# Patient Record
Sex: Male | Born: 1955
Health system: Southern US, Community
[De-identification: ages and names within clinical notes are randomized; demographics above are authoritative.]

## PROBLEM LIST (undated history)

## (undated) DIAGNOSIS — B1920 Unspecified viral hepatitis C without hepatic coma: Secondary | ICD-10-CM

## (undated) DIAGNOSIS — Z86010 Personal history of colonic polyps: Secondary | ICD-10-CM

## (undated) DIAGNOSIS — J189 Pneumonia, unspecified organism: Secondary | ICD-10-CM

## (undated) DIAGNOSIS — K449 Diaphragmatic hernia without obstruction or gangrene: Secondary | ICD-10-CM

## (undated) DIAGNOSIS — M1990 Unspecified osteoarthritis, unspecified site: Secondary | ICD-10-CM

## (undated) HISTORY — DX: Personal history of colonic polyps: Z86.010

## (undated) HISTORY — PX: COLONOSCOPY: SHX174

## (undated) HISTORY — DX: Unspecified viral hepatitis C without hepatic coma: B19.20

## (undated) HISTORY — DX: Diaphragmatic hernia without obstruction or gangrene: K44.9

## (undated) HISTORY — DX: Unspecified osteoarthritis, unspecified site: M19.90

---

## 1977-11-26 HISTORY — PX: TONSILLECTOMY: SUR1361

## 2004-11-26 HISTORY — PX: TOTAL HIP ARTHROPLASTY: SHX124

## 2005-06-11 ENCOUNTER — Inpatient Hospital Stay (HOSPITAL_COMMUNITY): Admission: RE | Admit: 2005-06-11 | Discharge: 2005-06-15 | Payer: Self-pay | Admitting: Orthopedic Surgery

## 2008-10-04 ENCOUNTER — Ambulatory Visit: Payer: Self-pay | Admitting: Gastroenterology

## 2008-11-26 HISTORY — PX: UMBILICAL HERNIA REPAIR: SHX196

## 2009-04-13 ENCOUNTER — Ambulatory Visit: Payer: Self-pay | Admitting: Internal Medicine

## 2009-04-27 ENCOUNTER — Encounter: Payer: Self-pay | Admitting: Internal Medicine

## 2009-04-27 ENCOUNTER — Ambulatory Visit: Payer: Self-pay | Admitting: Internal Medicine

## 2009-05-05 ENCOUNTER — Encounter: Payer: Self-pay | Admitting: Internal Medicine

## 2009-07-25 ENCOUNTER — Ambulatory Visit (HOSPITAL_COMMUNITY): Admission: RE | Admit: 2009-07-25 | Discharge: 2009-07-26 | Payer: Self-pay | Admitting: Surgery

## 2010-12-26 NOTE — Letter (Signed)
Summary: Patient Notice- Polyp Results  [facility]  [address] [place], [place] [address]   Phone: [phone]  Fax: [phone]        May 05, 2009 MRN: [id number]    [name] [address] [place], [place]  [address]    Dear [name],  The polyp removed from your colon was adenomatous. This means that it was pre-cancerous or that  it had the potential to change into cancer over time.   I recommend that you have a repeat colonoscopy in 3 years to determine if you have developed any new polyps over time. If you develop any new rectal bleeding, abdominal pain or significant bowel habit changes, please contact us before then.  Please call us if you are having persistent problems or have questions about your condition that have not been fully answered at this time.  Sincerely,  Iva Boop MD  This letter has been electronically signed by your physician.

## 2010-12-26 NOTE — Miscellaneous (Signed)
Summary: [facility] Previsit/prep  Clinical Lists Changes  Medications: Added new medication of MOVIPREP 100 GM  SOLR (PEG-KCL-NACL-NASULF-NA ASC-C) As per prep instructions. - Signed Rx of MOVIPREP 100 GM  SOLR (PEG-KCL-NACL-NASULF-NA ASC-C) As per prep instructions.;  #1 x 0;  Signed;  Entered by: Wyona Almas RN;  Authorized by: Mardella Layman MD [facility];  Method used: Electronically to [facility] [address], [place], [place], [place]  [address], Ph: [phone], Fax: [phone] Observations: Added new observation of NKA: T (10/04/2008 7:51)    Prescriptions: MOVIPREP 100 GM  SOLR (PEG-KCL-NACL-NASULF-NA ASC-C) As per prep instructions.  #1 x 0   Entered by:   Wyona Almas RN   Authorized by:   Mardella Layman MD [facility]   Signed by:   Wyona Almas RN on 10/04/2008   Method used:   Electronically to        [facility] [address] (retail)       [address]       [place], [place]  [address]       Ph: [phone]       Fax: [phone]   RxID:   [id number]

## 2010-12-26 NOTE — Procedures (Signed)
Summary: Colonoscopy: Tubulovillous adenoma   Colonoscopy  Procedure date:  04/27/2009  Findings:      Location:  [facility].    Colonoscopy  Procedure date:  04/27/2009  Findings:      1) 8 mm sessile polyp in the ascending colon, removed TV ADENOMA 2) Otherwise normal examination, good prep  Comments:      Repeat colonoscopy in 3 years.     Procedures Next Due Date:    Colonoscopy: 04/2012  COLONOSCOPY PROCEDURE REPORT  PATIENT:  [name], [name]  MR#:  [id number] BIRTHDATE:   1956/05/09, 55 yrs. old   GENDER:   male  ENDOSCOPIST:   Iva Boop, MD, [facility] Referred by:         Self-referred  PROCEDURE DATE:  04/27/2009 PROCEDURE:  Colonoscopy with snare polypectomy ASA CLASS:   Class I INDICATIONS: colorectal cancer screening, average risk   MEDICATIONS:    Fentanyl 75 mcg IV, Versed 7 mg IV  DESCRIPTION OF PROCEDURE:   After the risks benefits and alternatives of the procedure were thoroughly explained, informed consent was obtained.  Digital rectal exam was performed and revealed no abnormalities and normal prostate.   The LB CF-H180AL [id number] endoscope was introduced through the anus and advanced to the cecum in 2:19 minutes, which was identified by both the appendix and ileocecal valve, without limitations.  The quality of the prep was good, using MiraLax.  The instrument was then slowly withdrawn as the colon was fully examined in 10:26 minutes. <<PROCEDUREIMAGES>>      <<OLD IMAGES>>  FINDINGS:  A sessile polyp was found in the ascending colon. It was 8 mm in size. Polyp was snared, then cauterized with monopolar cautery. Retrieval was successful. snare polyp  This was otherwise a normal examination of the colon.   Retroflexed views in the rectum revealed no abnormalities.    The scope was then withdrawn from the patient and the procedure completed.  COMPLICATIONS:   None  ENDOSCOPIC IMPRESSION:  1) 8 mm sessile polyp in the ascending colon,  removed  2) Otherwise normal examination, good prep RECOMMENDATIONS:  1) No aspirin or NSAID's for 1 week.  REPEAT EXAM:   In for Colonoscopy, pending biopsy results.   Iva Boop, MD, [facility]  CC: The Patient     REPORT OF SURGICAL PATHOLOGY   Case #: [id number] Patient Name: [name], [name] Office Chart Number:  [id number]   MRN: [id number] Pathologist: Ivin Booty B. Colonel Bald, MD DOB/Age  December 03, 1955 (Age: 55)    Gender: M Date Taken:  04/27/2009 Date Received: 04/27/2009   FINAL DIAGNOSIS   ***MICROSCOPIC EXAMINATION AND DIAGNOSIS***   COLON, ASCENDING, POLYP: -  TUBULOVILLOUS ADENOMA. -  HIGH GRADE DYSPLASIA IS NOT IDENTIFIED.   mw Date Reported:  04/28/2009     Ivin Booty B. Colonel Bald, MD *** Electronically Signed Out By JBK ***    May 05, 2009 MRN: [id number]  [name] [address] [place], [place]  [address]    Dear Mr. [name],  The polyp removed from your colon was adenomatous. This means that it was pre-cancerous or that  it had the potential to change into cancer over time.   I recommend that you have a repeat colonoscopy in 3 years to determine if you have developed any new polyps over time. If you develop any new rectal bleeding, abdominal pain or significant bowel habit changes, please contact us before then.   Please call us if you are having persistent problems or have questions about your  condition that have not been fully answered at this time.  Sincerely,  Iva Boop MD  This letter has been electronically signed by your physician. This report was created from the original endoscopy report, which was reviewed and signed by the above listed endoscopist.

## 2010-12-26 NOTE — Miscellaneous (Signed)
Summary: [facility] Previsit/prep  Clinical Lists Changes  Medications: Added new medication of DULCOLAX 5 MG  TBEC (BISACODYL) Day before procedure take 2 at 3pm and 2 at 8pm. - Signed Added new medication of METOCLOPRAMIDE HCL 10 MG  TABS (METOCLOPRAMIDE HCL) As per prep instructions. - Signed Added new medication of MIRALAX   POWD (POLYETHYLENE GLYCOL 3350) As per prep  instructions. - Signed Rx of DULCOLAX 5 MG  TBEC (BISACODYL) Day before procedure take 2 at 3pm and 2 at 8pm.;  #4 x 0;  Signed;  Entered by: Wyona Almas RN;  Authorized by: Iva Boop MD;  Method used: Electronically to [facility] [address], [place], [place], [place]  [address], Ph: [phone], Fax: [phone] Rx of METOCLOPRAMIDE HCL 10 MG  TABS (METOCLOPRAMIDE HCL) As per prep instructions.;  #2 x 0;  Signed;  Entered by: Wyona Almas RN;  Authorized by: Iva Boop MD;  Method used: Electronically to [facility] [address] [place], [place], [place]  [address], Ph: [phone], Fax: [phone] Rx of MIRALAX   POWD (POLYETHYLENE GLYCOL 3350) As per prep  instructions.;  #255gm x 0;  Signed;  Entered by: Wyona Almas RN;  Authorized by: Iva Boop MD;  Method used: Electronically to [facility] [address], [place], [place], [place]  [address], Ph: [phone], Fax: [phone] Observations: Added new observation of NKA: T (04/13/2009 13:24)    Prescriptions: MIRALAX   POWD (POLYETHYLENE GLYCOL 3350) As per prep  instructions.  #255gm x 0   Entered by:   Wyona Almas RN   Authorized by:   Iva Boop MD   Signed by:   Wyona Almas RN on 04/13/2009   Method used:   Electronically to        [facility] [address] (retail)       [address]       [place], [place]  [address]       Ph: [phone]       Fax: [phone]   RxID:    [id number] METOCLOPRAMIDE HCL 10 MG  TABS (METOCLOPRAMIDE HCL) As per prep instructions.  #2 x 0   Entered by:   Wyona Almas RN   Authorized by:   Iva Boop MD   Signed by:   Wyona Almas RN on 04/13/2009   Method used:   Electronically to        [facility] [address] (retail)       [address]       [place], [place]  [address]       Ph: [phone]       Fax: [phone]   RxID:   [id number] DULCOLAX 5 MG  TBEC (BISACODYL) Day before procedure take 2 at 3pm and 2 at 8pm.  #4 x 0   Entered by:   Wyona Almas RN   Authorized by:   Iva Boop MD   Signed by:   Wyona Almas RN on 04/13/2009   Method used:   Electronically to        [facility] [address] (retail)       [address]       [place], [place]  [address]  Ph: [phone]       Fax: [phone]   RxID:   [id number]

## 2011-03-03 LAB — HEMOGLOBIN AND HEMATOCRIT, BLOOD
HCT: 42.5 % (ref 39.0–52.0)
Hemoglobin: 14.9 g/dL (ref 13.0–17.0)

## 2011-04-10 NOTE — Op Note (Signed)
NAME:  [name], [name] NO.:  [id number]   MEDICAL RECORD NO.:  [id number]          PATIENT TYPE:  AMB   LOCATION:  DAY                          FACILITY:  [facility]   PHYSICIAN:  Ardeth Sportsman, MD     DATE OF BIRTH:  1956-04-26   DATE OF PROCEDURE:  DATE OF DISCHARGE:                               OPERATIVE REPORT   PRIMARY CARE PHYSICIAN:  Vikki Ports, M.D.   SURGEON:  Ardeth Sportsman, MD   PREOPERATIVE DIAGNOSES:  1. Left inguinal hernia.  2. Possible right hernia.   POSTOPERATIVE DIAGNOSES:  Left greater than right bilateral indirect  inguinal hernia.   PROCEDURE PERFORMED:  Laparoscopic bilateral inguinal preperitoneal  exploration with repair.   ANESTHESIA:  1. General anesthesia.  2. Bilateral ilioinguinal/genitofemoral slash spermatic cord nerve      blocks.  3. Local anesthetic field block around port sites.   SPECIMENS:  None.   DRAINS:  None.   ESTIMATED BLOOD LOSS:  100 mL.   COMPLICATIONS:  None major.   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  He was also found to have evidence of a  definite hernia on the left groin and a probable one on the right as  well.    Known physiology of abdominal formation and pathophysiology of inguinal  herniation with its natural history.  Risks were discussed.  Options  discussed.  Recommendations made for laparoscopic bilateral inguinal  exploration with repair.  Risks, benefits, alternatives discussed.  Questions answered, and he agreed to proceed.   FINDINGS:  He had  left greater than right indirect inguinal hernias.  He had a significant volume of cord lipomas bilaterally.  There was no  evidence of any direct femoral or obturator defects.   DESCRIPTION OF PROCEDURE PERFORMED:  The patient had IV cefazolin just  prior to surgery.  He voided just prior to being brought  in the  operating room.  He had sequential compression devices active through  the entire case.  He underwent general anesthesia without any  difficulty.  He was positioned supine with both arms tucked.  His  abdomen and about the penis were prepped and draped in sterile fashion.  Surgical time-out confirmed our plan.   I placed a 12 mm Hasson port in the preperitoneal plane just to the  right of and underneath the right rectus muscle using Hasson technique.  Capno preperitoneum to 15 mmHg for good abdominal wall insufflation.  Camera dissection was used to free the peritoneum off the bilateral  lower quadrants.  Enough working space was created such that I could put  5-0 ports in the right and left midabdomen.  I turned attention towards  the left side which was the more obvious side.  I could free the  peritoneum off the anterior abdominal wall and the left flank.  Following the peritoneum, I could see it going into the internal ring  consistent with an indirect inguinal hernia.  He  had very large cord  lipomas which I carefully freed and skeletonized off the spermatic cord  to better identify the peritoneal hernia sac.  I was able to reduce the  cord lipomas and skeletonized them and free them out and reduce them.  I  was able to help reduce the inguinal hernia sac and peel that down.  I  peeled the inguinal hernia sac proximally off the spermatic cord vessels  and vas deferens as possible.  I made a window between the bladder and  the lateral bladder wall and the anteromedial pelvis.   A carried out dissection and a mirror image fashion on the right side.  Initially, the planes were not as well defined on the right side.  He  had had a vasectomy but he had never had any appendectomy but,  nonetheless, initially I think I was more in the retrorectal and not in  the true preperitoneal plane, but eventually I was able to reorganize it  and find the correct plane.  In dissecting the  peritoneum off the  inferior epigastric vessel on the right side, I did have some bleeding  from some of the branches of the intermuscular feeding branches.  These  were easily controlled with cautery at the time.  This was a source of  mostly oozing and bleeding.  In dissecting over the right side, I did  make a tear in the peritoneum, and this was closed using 3-0 Vicryl  stitch using laparoscopic intracorporeal suturing techniques.   I chose 15x15 cm ultralight weight polypropylene (Ultrapro) mesh.  I cut  a piece of mesh such that there was a five x five corner rested between  the bladder and the true pelvis.  The mesh laid well posteriorly,  laterally, superiorly and medially, and actually the superomedial tails  overlapped in the midline.  There was excellent coverage and overlap  such that there was at least 3 inches of circumferential coverage around  the internal rings.  The cord lipomas and hernia sacs were grasped and  elevated cephalad as capto preperitoneum was evacuated.  Ports were  removed.  I did make an opening in the peritoneum just at the umbilical  stalk and evacuated the rest of the capnoperitoneum.  Close the fascia  using a 0 Vicryl stitch.  Skin was closed with 4-0 Monocryl stitch.  Sterile dressings applied.  The patient was extubated and recovered in  stable condition.   I discussed postoperative care with the patient in the office and then  just prior to surgery.  I am about to discuss it with his wife as well  per his request.      Ardeth Sportsman, MD  Electronically Signed     SCG/MEDQ  D:  07/25/2009  T:  07/25/2009  Job:  [id number]   cc:   Vikki Ports, M.D.  Fax: [phone]   Anna Genre. Little, M.D.  Fax: [phone]

## 2011-04-13 NOTE — Discharge Summary (Signed)
NAME:  [name], [name] NO.:  [id number]   MEDICAL RECORD NO.:  [id number]          PATIENT TYPE:  INP   LOCATION:  1508                         FACILITY:  [facility]   PHYSICIAN:  Ollen Gross, M.D.    DATE OF BIRTH:  December 10, 1955   DATE OF ADMISSION:  06/11/2005  DATE OF DISCHARGE:  06/15/2005                                 DISCHARGE SUMMARY   ADMISSION DIAGNOSES:  1.  Osteoarthritis, left hip.  2.  History of pneumonia.  3.  Hepatitis C.   DISCHARGE DIAGNOSES:  1.  Osteoarthritis, left hip, status post left total hip arthroplasty.  2.  History of pneumonia.  3.  Hepatitis C.   PROCEDURE:  On June 11, 2005, left total hip.   SURGEON:  Dr. Lequita Halt.   ASSISTANT:  [name], P.A.C.   ANESTHESIA:  General.   BLOOD LOSS:  700 mL.  Hemovac drain x 1.   CONSULTATIONS:  None.   BRIEF HISTORY AND PHYSICAL:  [name] is a 55 year old male with severe end-  stage osteoarthritis of the left hip and intractable pain who now presents  for a left total hip arthroplasty.   LABORATORY DATA:  CBC preop:  Hemoglobin 15.0, hematocrit of 43.1, white  cell count 5300.  Differential normal.  Postop H&H 12.7 and 35.5.  PT/PTT  preop 13.2 and 28.0 respectively.  INR 1.0.  Serial PTs were followed, last  PT and INR of 25.7 and 2.3.  Chem panel all within normal limits.  Serial  chemistries were followed.  Sodium dropped a little bit from 138 to 131.  Potassium dropped some from 4.1 to 3.4.  Total bili was slightly elevated on  admission at 2.2.  Urinalysis negative.  Blood group type A negative.  Portable left hip film and pelvis film on June 11, 2005:  Good position and  alignment of the left hip prosthesis.  Two view of chest on June 07, 2005:  No active lung disease, questionable granuloma in the right mid-and-upper  lung field compared prior to chest x-ray, cardiomegaly.  Left hip preop film  on June 07, 2005:  Advanced degenerative changes of the left hip.  EKG  dated  June 07, 2005:  Sinus bradycardia.  Otherwise normal, unconfirmed.   HOSPITAL COURSE:  Admitted to [facility], and taken to the OR,  underwent above-stated procedure.  He tolerated it well.  Began recovery on  the orthopedic floor, and was started on PCA and p.o. analgesics.  Hemovac  drain placed at the time of surgery, pulled on day 1.  He started to get up  out of bed.  He was slow getting up initially, and had a little bit of  abdominal bloating.  By day 2,  he had a little bit of increase in belly  pain, and was constipated.  He was given laxatives, and did have small bowel  movements.  By day 2, he started getting up with therapy a little bit more.  On day 2, dressing was changed, and incision was healing well.  It did have  some blisters which  were covered with Tegaderm.  He was started on Reglan  and Gas-X for the abdominal bloating.  He did get response from this.  By  later that evening, he was feeling much better.  By day 3, he started  getting up and around a little bit more with therapy, and by day 4, he was  doing well, and discharged home.   DISCHARGE PLAN:  The patient was discharged home on June 15, 2005.   DISCHARGE DIAGNOSES:  Please see above.   DISCHARGE MEDICATIONS:  Coumadin, Percocet, Robaxin, and Z-Pak.   DIET:  As tolerated.   ACTIVITY:  He is partial weightbearing of 25-50% to the left lower  extremity.  Hip precautions at all times.  Total hip protocol.   FOLLOWUP:  Follow up in 2 weeks.  Call the office for an appointment at [phone].   DISPOSITION:  Home.   CONDITION ON DISCHARGE:  Improved.       ALP/MEDQ  D:  06/15/2005  T:  06/15/2005  Job:  [id number]   cc:   Ollen Gross, M.D.  [facility]  [address]  [place] [address]  [place]  [place] [address]  Fax: [phone]

## 2011-04-13 NOTE — H&P (Signed)
NAME:  [name], [name] NO.:  [id number]   MEDICAL RECORD NO.:  [id number]          PATIENT TYPE:  INP   LOCATION:  1508                         FACILITY[facility]   PHYSICIAN:  Ollen Gross, M.D.    DATE OF BIRTH:  Mar 27, 1956   DATE OF ADMISSION:  06/11/2005  DATE OF DISCHARGE:                                HISTORY & PHYSICAL   CHIEF COMPLAINT:  Left hip pain.   HISTORY OF PRESENT ILLNESS:  Patient is a 55 year old male who has recently  moved here from [place].  Comes in for evaluation of his left hip.  The pain  has been ongoing for about seven years now but has progressively gotten  worse over the past six months.  He was told by his orthopedic surgeon in  [place] that needed hip replacement. He has moved to [place] recently and  comes in for evaluation.  Extremely athletic throughout his life.  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  Unfortunately, his hip pain is interfering with his  activity, and he has reached the point where he would like to have something  done about it.  Risks and benefits have been discussed.  The patient elects  to proceed with surgery.  He has been seen in the office.  X-rays do show  that he has severe end-stage arthritis of the left hip with bone-on-bone  deformity.   ALLERGIES:  No known drug allergies.   CURRENT MEDICATIONS:  No meds except for the occasional pain pill in the  form of Vicodin.   PAST MEDICAL HISTORY:  1.  Pneumonia.  2.  History of hepatitis C.   PAST SURGICAL HISTORY:  1.  Tonsil surgery.  2.  Nose surgery.   SOCIAL HISTORY:  Married.  Currently works in Airline pilot.  Nonsmoker.  No  alcohol.  Has four children.   FAMILY HISTORY:  Mother is age 7 and in good health.  Father with a history  of stroke and heart disease.   REVIEW OF SYSTEMS:  GENERAL:  No fevers, chills, night sweats.  NEURO:  No  seizures, syncope, paralysis.  He does have  some muffled sounds and popping  pain in the right ear.  RESPIRATORY:  No shortness of breath, productive  cough, or hemoptysis.  CARDIOVASCULAR:  No chest pain, angina, orthopnea.  GI:  History of hepatitis C.  No nausea, vomiting, diarrhea, or  constipation.  GU:  No dysuria, hematuria, or discharge.  MUSCULOSKELETAL:  Left hip, from the history of present illness.   PHYSICAL EXAMINATION:  VITAL SIGNS:  Pulse 76, respirations 12, blood  pressure 142/82.  GENERAL:  A 55 year old white male who is well-developed and well-nourished,  large-framed, muscular build.  Alert, oriented and cooperative.  Very  pleasant.  No acute distress.  HEENT:  Normocephalic and atraumatic.  Pupils are round and reactive.  Oropharynx is clear.  EOMs are intact.  NECK:  Supple.  No carotid bruits.  CHEST:  Clear anterior and posterior chest wall.  No rales, rhonchi or  wheezes.  HEART:  Regular rate and rhythm without murmur.  ABDOMEN:  Soft, nontender.  Bowel sounds present.  RECTAL/BREASTS/GENITALIA:  Not done.  Not pertinent to the present illness.  EXTREMITIES:  Left hip:  The left hip only has flexion of about 90 degrees.  There is 0 internal rotation.  There is 0 external rotation with only about  5 degrees of abduction.  He does ambulate with an antalgic gait.   IMPRESSION:  1.  Osteoarthritis, left hip.  2.  History of pneumonia.  3.  Hepatitis C.   PLAN:  Patient is admitted to [facility] to undergo a left total  hip arthroplasty.  Surgery will be performed by Dr. Trudee Grip.       ALP/MEDQ  D:  06/11/2005  T:  06/11/2005  Job:  [id number]

## 2011-04-13 NOTE — Op Note (Signed)
NAME:  [name], [name] NO.:  [id number]   MEDICAL RECORD NO.:  [id number]          PATIENT TYPE:  INP   LOCATION:  NA                           FACILITY:  [facility]   PHYSICIAN:  Ollen Gross, M.D.    DATE OF BIRTH:  08-03-1956   DATE OF PROCEDURE:  06/11/2005  DATE OF DISCHARGE:                                 OPERATIVE REPORT   PREOPERATIVE DIAGNOSIS:  Osteoarthritis left hip.   POSTOPERATIVE DIAGNOSIS:  Osteoarthritis left hip.   PROCEDURE:  Left total hip arthroplasty.   SURGEON:  Ollen Gross, M.D.   ASSISTANT:  Alanson Aly. Julien Girt, P.A.   ANESTHESIA:  General.   ESTIMATED BLOOD LOSS:  700.   DRAINS:  Hemovac x1.   COMPLICATIONS:  None.   CONDITION:  Stable to recovery.   BRIEF CLINICAL NOTE:  [name] is a 55 year old male who has severe end-stage  arthritis of the left hip with intractable pain. He presents now for left  total hip arthroplasty.   PROCEDURE IN DETAIL:  After successful administration of general anesthetic,  the patient was placed in the right lateral decubitus position with the left  side up and held with a hip positioner. The left lower extremity was  isolated from his perineum with plastic drapes and prepped and draped in the  usual sterile fashion. A short posterolateral incision was made with a 10  blade through the subcutaneous tissue to the level of fascia lata which was  incised in line with a skin incision. The sciatic nerve was palpated and  protected and short rotators isolated off the femur. Capsulectomy was  performed and the hip was dislocated. The center of the femoral head is  marked and a trial prosthesis placed such that the center of the trial head  corresponds to the center of his native femoral head. Osteotomy lines marked  on the femoral neck and osteotomy made with an oscillating saw. The femoral  head is removed and then femur retracted anteriorly to gain acetabular  exposure.   Acetabular exposure was obtained  and then osteophytes and labrum were  removed. Reaming starts at 49 mm coursing in increments of 2 to 59 mm and  then a 60 mm pinnacle acetabular shell was placed in anatomic position with  excellent purchase and transfixed with a dome screw. Trial 36 mm neutral  liner was placed.   The femur was prepared first with the canal finder and then irrigation.  Axial reaming is performed up to 17.5 mm and proximal reaming up to a 22D.  The sleeve is machined to a large and a 22D large trial sleeve is placed  with a 22 x 17 stem and 36 plus 12 neck matching his native anteversion. A  36 plus 0 head was placed and there is a tiny bit of soft tissue laxity so I  went to a 36 plus 3. He had excellent stability with full extension, full  external rotation, 90 degrees of flexion, 70 degrees adduction, 90 degrees  internal rotation and then 90 degrees of flexion and 70 degrees of internal  rotation.  By placing the left leg on top of the right, the leg lengths were  found to be equal. All trials were then removed and the permanent apex hole  eliminator was placed into the acetabular shell. A permanent 36 mm neutral  Ultamet metal liner is then placed into the acetabular shell. This is a  metal-on-metal hip replacement. The permanent 22D large sleeve was then  placed into the proximal femur with a 22 x 17 stem, 36 plus 12 neck matching  his native anteversion. A 36 plus 3 head was placed and the hip is reduced  with the same stability parameters. The wound was copiously irrigated with  saline solution and short rotators reattached to the femur through drill  holes. Fascia lata was closed over a Hemovac drain with interrupted #1  Vicryl, subcu closed with #1 and #2-0 Vicryl and subcuticular running 4-0  Monocryl. Incisions cleaned and dried and Steri-Strips and a bulky sterile  dressing applied. A drain is hooked to suction and he is placed into a knee  immobilizer, awakened and transported to recovery in  stable condition.  Please note that I injected 30 mL of 0.25% Marcaine with epinephrine into  the subcutaneous tissues about the incision prior to placing the sterile  dressing.       FA/MEDQ  D:  06/11/2005  T:  06/12/2005  Job:  [id number]

## 2011-10-20 ENCOUNTER — Other Ambulatory Visit: Payer: Self-pay | Admitting: Orthopedic Surgery

## 2012-03-18 ENCOUNTER — Encounter: Payer: Self-pay | Admitting: Internal Medicine

## 2012-03-21 ENCOUNTER — Ambulatory Visit (HOSPITAL_COMMUNITY)
Admission: RE | Admit: 2012-03-21 | Payer: BC Managed Care – PPO | Source: Ambulatory Visit | Admitting: Orthopedic Surgery

## 2012-03-21 ENCOUNTER — Encounter (HOSPITAL_COMMUNITY): Admission: RE | Payer: Self-pay | Source: Ambulatory Visit

## 2012-03-21 SURGERY — ARTHROPLASTY, HIP, TOTAL,POSTERIOR APPROACH
Anesthesia: Choice | Site: Hip | Laterality: Right

## 2012-06-09 ENCOUNTER — Encounter: Payer: Self-pay | Admitting: Internal Medicine

## 2012-07-14 ENCOUNTER — Encounter: Payer: Self-pay | Admitting: Internal Medicine

## 2012-07-14 ENCOUNTER — Ambulatory Visit (AMBULATORY_SURGERY_CENTER): Payer: BC Managed Care – PPO | Admitting: *Deleted

## 2012-07-14 VITALS — Ht 76.0 in | Wt 306.0 lb

## 2012-07-14 DIAGNOSIS — Z1211 Encounter for screening for malignant neoplasm of colon: Secondary | ICD-10-CM

## 2012-07-14 MED ORDER — MOVIPREP 100 G PO SOLR: ORAL | Status: DC

## 2012-08-05 ENCOUNTER — Encounter: Payer: Self-pay | Admitting: Internal Medicine

## 2012-08-05 ENCOUNTER — Ambulatory Visit (AMBULATORY_SURGERY_CENTER): Payer: BC Managed Care – PPO | Admitting: Internal Medicine

## 2012-08-05 VITALS — BP 112/67 | HR 61 | Temp 96.8°F | Resp 96 | Ht 76.0 in | Wt 306.0 lb

## 2012-08-05 DIAGNOSIS — K5730 Diverticulosis of large intestine without perforation or abscess without bleeding: Secondary | ICD-10-CM

## 2012-08-05 DIAGNOSIS — Z860100 Personal history of colon polyps, unspecified: Secondary | ICD-10-CM

## 2012-08-05 DIAGNOSIS — Z1211 Encounter for screening for malignant neoplasm of colon: Secondary | ICD-10-CM

## 2012-08-05 HISTORY — DX: Personal history of colon polyps, unspecified: Z86.0100

## 2012-08-05 HISTORY — DX: Personal history of colonic polyps: Z86.010

## 2012-08-05 MED ORDER — SODIUM CHLORIDE 0.9 % IV SOLN: 500.0000 mL | INTRAVENOUS | Status: DC

## 2012-08-05 NOTE — Patient Instructions (Addendum)
The colonoscopy showed some diverticulosis but no polyps or cancer.  Next routine colonoscopy in 5 years - 2018  Thank you for choosing me and [facility].  Iva Boop, MD, FACGYOU HAD AN ENDOSCOPIC PROCEDURE TODAY AT THE [facility]: Refer to the procedure report that was given to you for any specific questions about what was found during the examination.  If the procedure report does not answer your questions, please call your gastroenterologist to clarify.  If you requested that your care partner not be given the details of your procedure findings, then the procedure report has been included in a sealed envelope for you to review at your convenience later.  YOU SHOULD EXPECT: Some feelings of bloating in the abdomen. Passage of more gas than usual.  Walking can help get rid of the air that was put into your GI tract during the procedure and reduce the bloating. If you had a lower endoscopy (such as a colonoscopy or flexible sigmoidoscopy) you may notice spotting of blood in your stool or on the toilet paper. If you underwent a bowel prep for your procedure, then you may not have a normal bowel movement for a few days.  DIET: Your first meal following the procedure should be a light meal and then it is ok to progress to your normal diet.  A half-sandwich or bowl of soup is an example of a good first meal.  Heavy or fried foods are harder to digest and may make you feel nauseous or bloated.  Likewise meals heavy in dairy and vegetables can cause extra gas to form and this can also increase the bloating.  Drink plenty of fluids but you should avoid alcoholic beverages for 24 hours.  ACTIVITY: Your care partner should take you home directly after the procedure.  You should plan to take it easy, moving slowly for the rest of the day.  You can resume normal activity the day after the procedure however you should NOT DRIVE or use heavy machinery for 24 hours (because of the  sedation medicines used during the test).    SYMPTOMS TO REPORT IMMEDIATELY: A gastroenterologist can be reached at any hour.  During normal business hours, 8:30 AM to 5:00 PM Monday through Friday, call [phone].  After hours and on weekends, please call the GI answering service at [phone] who will take a message and have the physician on call contact you.   Following lower endoscopy (colonoscopy or flexible sigmoidoscopy):  Excessive amounts of blood in the stool  Significant tenderness or worsening of abdominal pains  Swelling of the abdomen that is new, acute  Fever of 100F or higher  Following upper endoscopy (EGD)  Vomiting of blood or coffee ground material  New chest pain or pain under the shoulder blades  Painful or persistently difficult swallowing  New shortness of breath  Fever of 100F or higher  Black, tarry-looking stools  FOLLOW UP: If any biopsies were taken you will be contacted by phone or by letter within the next 1-3 weeks.  Call your gastroenterologist if you have not heard about the biopsies in 3 weeks.  Our staff will call the home number listed on your records the next business day following your procedure to check on you and address any questions or concerns that you may have at that time regarding the information given to you following your procedure. This is a courtesy call and so if there is no answer at the home number  and we have not heard from you through the emergency physician on call, we will assume that you have returned to your regular daily activities without incident.  SIGNATURES/CONFIDENTIALITY: You and/or your care partner have signed paperwork which will be entered into your electronic medical record.  These signatures attest to the fact that that the information above on your After Visit Summary has been reviewed and is understood.  Full responsibility of the confidentiality of this discharge information lies with you and/or your  care-partner.   Diverticulosis and high fiber diet information given.

## 2012-08-05 NOTE — Progress Notes (Signed)
Patient did not experience any of the following events: a burn prior to discharge; a fall within the facility; wrong site/side/patient/procedure/implant event; or a hospital transfer or hospital admission upon discharge from the facility. (G8907) Patient did not have preoperative order for IV antibiotic SSI prophylaxis. (G8918)  

## 2012-08-05 NOTE — Op Note (Signed)
[facility] [address] [place], [address]   COLONOSCOPY PROCEDURE REPORT  PATIENT: [name], [name]  MR#: [id number] BIRTHDATE: 04/29/1956 , 56  yrs. old GENDER: Male ENDOSCOPIST: Iva Boop, MD, [facility] REFERRED BY: PROCEDURE DATE:  08/05/2012 PROCEDURE:   Colonoscopy, diagnostic ASA CLASS:   Class I INDICATIONS:screening and surveillance,personal history of adenomatous colon polyps. MEDICATIONS: propofol (Diprivan) 250mg  IV, MAC sedation, administered by CRNA, and These medications were titrated to patient response per physician's verbal order  DESCRIPTION OF PROCEDURE:   After the risks benefits and alternatives of the procedure were thoroughly explained, informed consent was obtained.  A digital rectal exam revealed no abnormalities of the rectum and A digital rectal exam revealed the prostate was not enlarged.   The LB CF-[id number]  endoscope was introduced through the anus and advanced to the cecum, which was identified by both the appendix and ileocecal valve. No adverse events experienced.   The quality of the prep was excellent, using MoviPrep  The instrument was then slowly withdrawn as the colon was fully examined.      COLON FINDINGS: Mild diverticulosis was noted in the sigmoid colon. The colon mucosa was otherwise normal.  Retroflexed views in right colon and rectum  revealed no abnormalities. The time to cecum=2 minutes 50 seconds.  Withdrawal time=7 minutes 58 seconds.  The scope was withdrawn and the procedure completed. COMPLICATIONS: There were no complications.  ENDOSCOPIC IMPRESSION: 1.   Mild diverticulosis was noted in the sigmoid colon 2.   The colon mucosa was otherwise normal with excellent prep 3.   Personal history of 8 mm tubulovillous adenoma 2010  RECOMMENDATIONS: repeat Colonoscopy in 5 years.   eSigned:  Iva Boop, MD, [facility] 08/05/2012 9:06 AM   cc: The Patient   PATIENT NAME:  [name], [name] MR#:  [id number]

## 2012-08-05 NOTE — Progress Notes (Signed)
The pt tolerated the colonoscopy very well. Maw   

## 2012-08-06 ENCOUNTER — Telehealth: Payer: Self-pay | Admitting: *Deleted

## 2012-08-06 NOTE — Telephone Encounter (Signed)
  Follow up Call-  Call back number 08/05/2012  Post procedure Call Back phone  # [phone]  Permission to leave phone message Yes     Patient questions:  Do you have a fever, pain , or abdominal swelling? no Pain Score  0 *  Have you tolerated food without any problems? yes  Have you been able to return to your normal activities? yes  Do you have any questions about your discharge instructions: Diet   no Medications  no Follow up visit  no  Do you have questions or concerns about your Care? no  Actions: * If pain score is 4 or above: No action needed, pain <4.

## 2013-09-09 ENCOUNTER — Other Ambulatory Visit: Payer: Self-pay | Admitting: Otolaryngology

## 2013-09-09 DIAGNOSIS — H905 Unspecified sensorineural hearing loss: Secondary | ICD-10-CM

## 2013-09-09 DIAGNOSIS — H903 Sensorineural hearing loss, bilateral: Secondary | ICD-10-CM

## 2013-09-23 ENCOUNTER — Ambulatory Visit
Admission: RE | Admit: 2013-09-23 | Discharge: 2013-09-23 | Disposition: A | Discharge status: Home / Self Care | Payer: BC Managed Care – PPO | Source: Ambulatory Visit | Attending: Otolaryngology | Admitting: Otolaryngology

## 2013-09-23 DIAGNOSIS — H905 Unspecified sensorineural hearing loss: Secondary | ICD-10-CM

## 2013-09-23 DIAGNOSIS — H903 Sensorineural hearing loss, bilateral: Secondary | ICD-10-CM

## 2013-09-23 MED ORDER — GADOBENATE DIMEGLUMINE 529 MG/ML IV SOLN
20.0000 mL | Freq: Once | INTRAVENOUS | Status: AC | PRN
  Administered 2013-09-23: 20 mL via INTRAVENOUS

## 2013-10-19 NOTE — Progress Notes (Signed)
Surgery scheduled for 11/06/13.  Preop on 10/29/13 at 100pm.  Need orders in EPIC.  Thank You.

## 2013-10-20 ENCOUNTER — Other Ambulatory Visit: Payer: Self-pay | Admitting: Orthopedic Surgery

## 2013-10-20 NOTE — Progress Notes (Signed)
Preoperative surgical orders have been place into the Epic hospital system for [name] on 10/20/2013, 1:18 PM  by Patrica Duel for surgery on 11/06/2013.  Preop Total Hip orders including Experel Injecion, PO Tylenol, and IV Decadron as long as there are no contraindications to the above medications. Avel Peace, PA-C

## 2013-10-26 ENCOUNTER — Encounter (HOSPITAL_COMMUNITY): Payer: Self-pay | Admitting: Pharmacy Technician

## 2013-10-29 ENCOUNTER — Encounter (INDEPENDENT_AMBULATORY_CARE_PROVIDER_SITE_OTHER): Payer: Self-pay

## 2013-10-29 ENCOUNTER — Encounter (HOSPITAL_COMMUNITY): Payer: Self-pay

## 2013-10-29 ENCOUNTER — Encounter (HOSPITAL_COMMUNITY)
Admission: RE | Admit: 2013-10-29 | Discharge: 2013-10-29 | Disposition: A | Discharge status: Home / Self Care | Payer: BC Managed Care – PPO | Source: Ambulatory Visit | Attending: Orthopedic Surgery | Admitting: Orthopedic Surgery

## 2013-10-29 ENCOUNTER — Ambulatory Visit (HOSPITAL_COMMUNITY)
Admission: RE | Admit: 2013-10-29 | Discharge: 2013-10-29 | Disposition: A | Discharge status: Home / Self Care | Payer: BC Managed Care – PPO | Source: Ambulatory Visit | Attending: Orthopedic Surgery | Admitting: Orthopedic Surgery

## 2013-10-29 DIAGNOSIS — Z01818 Encounter for other preprocedural examination: Secondary | ICD-10-CM | POA: Insufficient documentation

## 2013-10-29 DIAGNOSIS — M12359 Palindromic rheumatism, unspecified hip: Secondary | ICD-10-CM | POA: Insufficient documentation

## 2013-10-29 HISTORY — DX: Pneumonia, unspecified organism: J18.9

## 2013-10-29 LAB — COMPREHENSIVE METABOLIC PANEL
ALT: 71 U/L — ABNORMAL HIGH (ref 0–53)
AST: 24 U/L (ref 0–37)
Albumin: 4.2 g/dL (ref 3.5–5.2)
Alkaline Phosphatase: 57 U/L (ref 39–117)
BUN: 15 mg/dL (ref 6–23)
CO2: 25 mEq/L (ref 19–32)
Calcium: 9.4 mg/dL (ref 8.4–10.5)
Chloride: 100 mEq/L (ref 96–112)
Creatinine, Ser: 0.97 mg/dL (ref 0.50–1.35)
GFR calc Af Amer: >90 mL/min (ref >90)
GFR calc non Af Amer: >90 mL/min (ref >90)
Glucose, Bld: 91 mg/dL (ref 70–99)
Potassium: 3.8 mEq/L (ref 3.5–5.1)
Sodium: 135 mEq/L (ref 135–145)
Total Bilirubin: 1.5 mg/dL — ABNORMAL HIGH (ref 0.3–1.2)
Total Protein: 7.9 g/dL (ref 6.0–8.3)

## 2013-10-29 LAB — CBC
HCT: 44.3 % (ref 39.0–52.0)
Hemoglobin: 15.9 g/dL (ref 13.0–17.0)
MCH: 33.3 pg (ref 26.0–34.0)
MCHC: 35.9 g/dL (ref 30.0–36.0)
MCV: 92.7 fL (ref 78.0–100.0)
Platelets: 186 10*3/uL (ref 150–400)
RBC: 4.78 MIL/uL (ref 4.22–5.81)
RDW: 12.3 % (ref 11.5–15.5)
WBC: 6.1 10*3/uL (ref 4.0–10.5)

## 2013-10-29 LAB — URINALYSIS, ROUTINE W REFLEX MICROSCOPIC
Bilirubin Urine: NEGATIVE
Glucose, UA: NEGATIVE mg/dL
Hgb urine dipstick: NEGATIVE
Ketones, ur: NEGATIVE mg/dL
Leukocytes, UA: NEGATIVE
Nitrite: NEGATIVE
Protein, ur: NEGATIVE mg/dL
Specific Gravity, Urine: 1.011 (ref 1.005–1.030)
Urobilinogen, UA: 0.2 mg/dL (ref 0.0–1.0)
pH: 6 (ref 5.0–8.0)

## 2013-10-29 LAB — APTT: aPTT: 26 seconds (ref 24–37)

## 2013-10-29 LAB — ABO/RH: ABO/RH(D): A NEG

## 2013-10-29 LAB — SURGICAL PCR SCREEN
MRSA, PCR: NEGATIVE
Staphylococcus aureus: POSITIVE — AB

## 2013-10-29 LAB — PROTIME-INR
INR: 0.95 (ref 0.00–1.49)
Prothrombin Time: 12.5 seconds (ref 11.6–15.2)

## 2013-10-29 NOTE — Patient Instructions (Addendum)
20 [name]  10/29/2013   Your procedure is scheduled on: 11/06/13  Report to [facility] at 10:45 AM.  Call this number if you have problems the morning of surgery 336-: [phone]   Remember:   Do not eat food or drink liquids After Midnight.    Do not wear jewelry, make-up or nail polish.  Do not wear lotions, powders, or perfumes. You may wear deodorant.  Do not shave 48 hours prior to surgery. Men may shave face and neck.  Do not bring valuables to the hospital.  Leave suitcase in the car. After surgery it may be brought to your room.  For patients admitted to the hospital, checkout time is 11:00 AM the day of discharge.   Please read over the following fact sheets that you were given: MRSA Information, blood fact sheet, incentive spirometry fact sheet  Birdie Sons, RN  pre op nurse call if needed [phone]    FAILURE TO FOLLOW THESE INSTRUCTIONS MAY RESULT IN CANCELLATION OF YOUR SURGERY   Patient Signature: ___________________________________________

## 2013-10-29 NOTE — Progress Notes (Signed)
Surgery clearance note 10/28/13 on chart

## 2013-11-05 ENCOUNTER — Other Ambulatory Visit: Payer: Self-pay | Admitting: Orthopedic Surgery

## 2013-11-05 MED ORDER — DEXTROSE 5 % IV SOLN
3.0000 g | INTRAVENOUS | Status: AC
  Administered 2013-11-06: 3 g via INTRAVENOUS
  Filled 2013-11-05: qty 3000

## 2013-11-05 NOTE — H&P (Signed)
[name]  DOB: Aug 26, 1956 Married / Language: English / Race: White Male  Date of Admission:  11/06/2013  Chief Complaint:  Right Hip Pain  History of Present Illness The patient is a 57 year old male who comes in for a preoperative History and Physical. The patient is scheduled for a right total hip arthroplasty to be performed by Dr. Gus Rankin. Aluisio, MD at [facility] on 11/06/2013. The patient is a 57 year old male who presents for follow up of their hip. The patient is being followed for their right hip pain and osteoarthritis. Symptoms reported today include: pain. The patient feels that they are doing poorly. The following medication has been used for pain control: none. He has been had increasing right hip pain as well as decreasing mobility over the past 2 years. He has not had an injury. He says that the right hip is not as bad a the left hip was prior to THA but that he feels that he waited too long to have the left hip replaced. The left hip is doing well at this time. Currently schedule for the right THA on 11/06/13 and he is ready to proceed with surgery. They have been treated conservatively in the past for the above stated problem and despite conservative measures, they continue to have progressive pain and severe functional limitations and dysfunction. They have failed non-operative management including home exercise, medications. It is felt that they would benefit from undergoing total joint replacement. Risks and benefits of the procedure have been discussed with the patient and they elect to proceed with surgery. There are no active contraindications to surgery such as ongoing infection or rapidly progressive neurological disease.   Problem List/Past Medical Hip osteoarthritis (715.95) Liver disease Hepatitis C   Allergies No Known Drug Allergies. 11/06/2011    Family History Cerebrovascular Accident. Father. Congestive Heart Failure.  Father.    Social History Marital status. married No history of drug/alcohol rehab Living situation. live with spouse Current work status. working full time Exercise. Exercises daily; does running / walking and gym / weights Not under pain contract Tobacco use. Never smoker. 10/08/2013 Number of flights of stairs before winded. 2-3 Tobacco / smoke exposure. 10/08/2013: no Alcohol use. Formerly drank alcohol. Children. 4 Current drinker. 10/08/2013: Currently drinks beer and wine only occasionally per week Post-Surgical Plans. Plan for home.    Medication History Not currently taking any medications Active.    Past Surgical History Total Hip Replacement. left Tonsillectomy Inguinal Hernia Repair. laparoscopic: right   Review of Systems General:Not Present- Chills, Fever, Night Sweats, Fatigue, Weight Gain, Weight Loss and Memory Loss. Skin:Not Present- Hives, Itching, Rash, Eczema and Lesions. HEENT:Not Present- Tinnitus, Headache, Double Vision, Visual Loss, Hearing Loss and Dentures. Respiratory:Not Present- Shortness of breath with exertion, Shortness of breath at rest, Allergies, Coughing up blood and Chronic Cough. Cardiovascular:Not Present- Chest Pain, Racing/skipping heartbeats, Difficulty Breathing Lying Down, Murmur, Swelling and Palpitations. Gastrointestinal:Not Present- Bloody Stool, Heartburn, Abdominal Pain, Vomiting, Nausea, Constipation, Diarrhea, Difficulty Swallowing, Jaundice and Loss of appetitie. Male Genitourinary:Not Present- Urinary frequency, Blood in Urine, Weak urinary stream, Discharge, Flank Pain, Incontinence, Painful Urination, Urgency, Urinary Retention and Urinating at Night. Musculoskeletal:Not Present- Muscle Weakness, Muscle Pain, Joint Swelling, Joint Pain, Back Pain, Morning Stiffness and Spasms. Neurological:Not Present- Tremor, Dizziness, Blackout spells, Paralysis, Difficulty with balance and  Weakness. Psychiatric:Not Present- Insomnia.    Vitals Pulse: 64 (Regular) Resp.: 14 (Unlabored) BP: 128/82 (Sitting, Left Arm, Standard)  Physical Exam The physical exam findings are as follows:   General Mental Status - Alert, cooperative and good historian. General Appearance- pleasant. Not in acute distress. Orientation- Oriented X3. Build & Nutrition- Well nourished and Well developed.   Head and Neck Head- normocephalic, atraumatic . Neck Global Assessment- supple. no bruit auscultated on the right and no bruit auscultated on the left.   Eye Pupil- Bilateral- Regular and Round. Motion- Bilateral- EOMI.   Chest and Lung Exam Auscultation: Breath sounds:- clear at anterior chest wall and - clear at posterior chest wall. Adventitious sounds:- No Adventitious sounds.   Cardiovascular Auscultation:Rhythm- Regular rate and rhythm. Heart Sounds- S1 WNL and S2 WNL. Murmurs & Other Heart Sounds:Auscultation of the heart reveals - No Murmurs.   Abdomen Palpation/Percussion:Tenderness- Abdomen is non-tender to palpation. Rigidity (guarding)- Abdomen is soft. Auscultation:Auscultation of the abdomen reveals - Bowel sounds normal.   Male Genitourinary Not done, not pertinent to present illness  Musculoskeletal Well developed male in no distress. His right hip can be flexed to 100, no internal rotation, about 10 degrees of external rotation, 20 degrees of abduction. He walks with a limp.  RADIOGRAPHS: AP pelvis, lateral right hip bone on bone arthritis of the hip with some subchondral cystic changes and large osteophytes.   Assessment & Plan Hip osteoarthritis (715.95) Impression: Right Hip  Note: Plan is for a Right Total Hip Replacement by Dr. Lequita Halt.  Plan is to go home.  The patient does not have any contraindications and will receive TXA (tranexamic acid) prior to surgery.  Signed electronically by  Lauraine Rinne, III PA-C

## 2013-11-06 ENCOUNTER — Inpatient Hospital Stay (HOSPITAL_COMMUNITY): Payer: BC Managed Care – PPO

## 2013-11-06 ENCOUNTER — Inpatient Hospital Stay (HOSPITAL_COMMUNITY)
Admission: RE | Admit: 2013-11-06 | Discharge: 2013-11-08 | DRG: 470 | Disposition: A | Discharge status: Home Health | Payer: BC Managed Care – PPO | Source: Ambulatory Visit | Attending: Orthopedic Surgery | Admitting: Orthopedic Surgery

## 2013-11-06 ENCOUNTER — Encounter (HOSPITAL_COMMUNITY): Payer: BC Managed Care – PPO | Admitting: Anesthesiology

## 2013-11-06 ENCOUNTER — Ambulatory Visit (HOSPITAL_COMMUNITY): Payer: BC Managed Care – PPO | Admitting: Anesthesiology

## 2013-11-06 ENCOUNTER — Encounter (HOSPITAL_COMMUNITY): Payer: Self-pay

## 2013-11-06 ENCOUNTER — Encounter (HOSPITAL_COMMUNITY)
Admission: RE | Disposition: A | Discharge status: Home Health | Payer: Self-pay | Source: Ambulatory Visit | Attending: Orthopedic Surgery

## 2013-11-06 DIAGNOSIS — B1920 Unspecified viral hepatitis C without hepatic coma: Secondary | ICD-10-CM | POA: Diagnosis present

## 2013-11-06 DIAGNOSIS — M169 Osteoarthritis of hip, unspecified: Secondary | ICD-10-CM | POA: Diagnosis present

## 2013-11-06 DIAGNOSIS — M1610 Unilateral primary osteoarthritis, unspecified hip: Principal | ICD-10-CM | POA: Diagnosis present

## 2013-11-06 HISTORY — PX: TOTAL HIP ARTHROPLASTY: SHX124

## 2013-11-06 LAB — TYPE AND SCREEN
ABO/RH(D): A NEG
Antibody Screen: NEGATIVE

## 2013-11-06 SURGERY — ARTHROPLASTY, HIP, TOTAL,POSTERIOR APPROACH
Anesthesia: General | Site: Hip | Laterality: Right

## 2013-11-06 MED ORDER — DOCUSATE SODIUM 100 MG PO CAPS
100.0000 mg | ORAL_CAPSULE | Freq: Two times a day (BID) | ORAL | Status: DC
  Administered 2013-11-06 – 2013-11-08 (×4): 100 mg via ORAL

## 2013-11-06 MED ORDER — HYDROMORPHONE HCL PF 1 MG/ML IJ SOLN
INTRAMUSCULAR | Status: AC
  Filled 2013-11-06: qty 1

## 2013-11-06 MED ORDER — TRAMADOL HCL 50 MG PO TABS: 50.0000 mg | ORAL_TABLET | Freq: Four times a day (QID) | ORAL | Status: DC | PRN

## 2013-11-06 MED ORDER — PROPOFOL 10 MG/ML IV BOLUS: INTRAVENOUS | Status: DC | PRN

## 2013-11-06 MED ORDER — KETAMINE HCL 50 MG/ML IJ SOLN
INTRAMUSCULAR | Status: DC | PRN
  Administered 2013-11-06 (×2): 25 mg via INTRAMUSCULAR

## 2013-11-06 MED ORDER — FLEET ENEMA 7-19 GM/118ML RE ENEM: 1.0000 | ENEMA | Freq: Once | RECTAL | Status: AC | PRN

## 2013-11-06 MED ORDER — LIDOCAINE HCL (CARDIAC) 20 MG/ML IV SOLN
INTRAVENOUS | Status: AC
  Filled 2013-11-06: qty 5

## 2013-11-06 MED ORDER — PROPOFOL 10 MG/ML IV BOLUS
INTRAVENOUS | Status: DC | PRN
  Administered 2013-11-06: 300 mg via INTRAVENOUS

## 2013-11-06 MED ORDER — DEXAMETHASONE 6 MG PO TABS
10.0000 mg | ORAL_TABLET | Freq: Every day | ORAL | Status: AC
  Administered 2013-11-07: 10 mg via ORAL
  Filled 2013-11-06: qty 1

## 2013-11-06 MED ORDER — RIVAROXABAN 10 MG PO TABS
10.0000 mg | ORAL_TABLET | Freq: Every day | ORAL | Status: DC
  Administered 2013-11-07 – 2013-11-08 (×2): 10 mg via ORAL
  Filled 2013-11-06 (×3): qty 1

## 2013-11-06 MED ORDER — NEOSTIGMINE METHYLSULFATE 1 MG/ML IJ SOLN
INTRAMUSCULAR | Status: DC | PRN
  Administered 2013-11-06: 5 mg via INTRAVENOUS

## 2013-11-06 MED ORDER — ACETAMINOPHEN 650 MG RE SUPP: 650.0000 mg | Freq: Four times a day (QID) | RECTAL | Status: DC | PRN

## 2013-11-06 MED ORDER — CEFAZOLIN SODIUM-DEXTROSE 2-3 GM-% IV SOLR
2.0000 g | Freq: Four times a day (QID) | INTRAVENOUS | Status: AC
  Administered 2013-11-06 – 2013-11-07 (×2): 2 g via INTRAVENOUS
  Filled 2013-11-06 (×2): qty 50

## 2013-11-06 MED ORDER — LACTATED RINGERS IV SOLN
INTRAVENOUS | Status: DC
  Administered 2013-11-06: 1000 mL via INTRAVENOUS

## 2013-11-06 MED ORDER — NEOSTIGMINE METHYLSULFATE 1 MG/ML IJ SOLN
INTRAMUSCULAR | Status: AC
  Filled 2013-11-06: qty 10

## 2013-11-06 MED ORDER — DIPHENHYDRAMINE HCL 12.5 MG/5ML PO ELIX: 12.5000 mg | ORAL_SOLUTION | ORAL | Status: DC | PRN

## 2013-11-06 MED ORDER — KETOROLAC TROMETHAMINE 15 MG/ML IJ SOLN
7.5000 mg | Freq: Four times a day (QID) | INTRAMUSCULAR | Status: AC | PRN
  Administered 2013-11-06: 7.5 mg via INTRAVENOUS

## 2013-11-06 MED ORDER — MIDAZOLAM HCL 2 MG/2ML IJ SOLN
INTRAMUSCULAR | Status: AC
  Filled 2013-11-06: qty 2

## 2013-11-06 MED ORDER — PHENOL 1.4 % MT LIQD: 1.0000 | OROMUCOSAL | Status: DC | PRN

## 2013-11-06 MED ORDER — LACTATED RINGERS IV SOLN: INTRAVENOUS | Status: DC

## 2013-11-06 MED ORDER — ACETAMINOPHEN 325 MG PO TABS
650.0000 mg | ORAL_TABLET | Freq: Four times a day (QID) | ORAL | Status: DC | PRN
  Administered 2013-11-07: 650 mg via ORAL
  Filled 2013-11-06: qty 2

## 2013-11-06 MED ORDER — OXYCODONE HCL 5 MG PO TABS
5.0000 mg | ORAL_TABLET | ORAL | Status: DC | PRN
  Administered 2013-11-06 – 2013-11-07 (×5): 20 mg via ORAL
  Administered 2013-11-07: 10 mg via ORAL
  Administered 2013-11-07 – 2013-11-08 (×2): 20 mg via ORAL
  Administered 2013-11-08 (×2): 10 mg via ORAL
  Administered 2013-11-08: 20 mg via ORAL
  Filled 2013-11-06: qty 4
  Filled 2013-11-06: qty 2
  Filled 2013-11-06 (×3): qty 4
  Filled 2013-11-06: qty 2
  Filled 2013-11-06 (×5): qty 4
  Filled 2013-11-06: qty 2
  Filled 2013-11-06: qty 4
  Filled 2013-11-06: qty 2

## 2013-11-06 MED ORDER — ONDANSETRON HCL 4 MG/2ML IJ SOLN
INTRAMUSCULAR | Status: DC | PRN
  Administered 2013-11-06: 4 mg via INTRAVENOUS

## 2013-11-06 MED ORDER — MIDAZOLAM HCL 2 MG/2ML IJ SOLN
1.0000 mg | INTRAMUSCULAR | Status: DC | PRN
  Administered 2013-11-06: 2 mg via INTRAVENOUS

## 2013-11-06 MED ORDER — SODIUM CHLORIDE 0.9 % IJ SOLN
INTRAMUSCULAR | Status: DC | PRN
  Administered 2013-11-06: 15:00:00

## 2013-11-06 MED ORDER — DEXAMETHASONE SODIUM PHOSPHATE 10 MG/ML IJ SOLN
10.0000 mg | Freq: Every day | INTRAMUSCULAR | Status: AC
  Filled 2013-11-06: qty 1

## 2013-11-06 MED ORDER — MORPHINE SULFATE 2 MG/ML IJ SOLN
1.0000 mg | INTRAMUSCULAR | Status: DC | PRN
  Administered 2013-11-06 – 2013-11-07 (×2): 2 mg via INTRAVENOUS
  Filled 2013-11-06 (×2): qty 1

## 2013-11-06 MED ORDER — FENTANYL CITRATE 0.05 MG/ML IJ SOLN
INTRAMUSCULAR | Status: DC | PRN
  Administered 2013-11-06: 50 ug via INTRAVENOUS
  Administered 2013-11-06: 100 ug via INTRAVENOUS
  Administered 2013-11-06 (×2): 50 ug via INTRAVENOUS

## 2013-11-06 MED ORDER — BUPIVACAINE HCL 0.25 % IJ SOLN
INTRAMUSCULAR | Status: DC | PRN
  Administered 2013-11-06: 30 mL

## 2013-11-06 MED ORDER — TRANEXAMIC ACID 100 MG/ML IV SOLN
1000.0000 mg | INTRAVENOUS | Status: AC
  Administered 2013-11-06: 1000 mg via INTRAVENOUS
  Filled 2013-11-06: qty 10

## 2013-11-06 MED ORDER — BUPIVACAINE LIPOSOME 1.3 % IJ SUSP
20.0000 mL | Freq: Once | INTRAMUSCULAR | Status: DC
  Filled 2013-11-06 (×2): qty 20

## 2013-11-06 MED ORDER — METHOCARBAMOL 100 MG/ML IJ SOLN
500.0000 mg | Freq: Four times a day (QID) | INTRAMUSCULAR | Status: DC | PRN
  Administered 2013-11-06: 500 mg via INTRAVENOUS
  Filled 2013-11-06: qty 5

## 2013-11-06 MED ORDER — HYDROMORPHONE HCL PF 1 MG/ML IJ SOLN
0.2500 mg | INTRAMUSCULAR | Status: DC | PRN
  Administered 2013-11-06 (×4): 0.5 mg via INTRAVENOUS

## 2013-11-06 MED ORDER — ONDANSETRON HCL 4 MG/2ML IJ SOLN: 4.0000 mg | Freq: Four times a day (QID) | INTRAMUSCULAR | Status: DC | PRN

## 2013-11-06 MED ORDER — GLYCOPYRROLATE 0.2 MG/ML IJ SOLN
INTRAMUSCULAR | Status: DC | PRN
  Administered 2013-11-06: .7 mg via INTRAVENOUS

## 2013-11-06 MED ORDER — DEXAMETHASONE SODIUM PHOSPHATE 10 MG/ML IJ SOLN
10.0000 mg | Freq: Once | INTRAMUSCULAR | Status: AC
  Administered 2013-11-06: 10 mg via INTRAVENOUS

## 2013-11-06 MED ORDER — BISACODYL 10 MG RE SUPP: 10.0000 mg | Freq: Every day | RECTAL | Status: DC | PRN

## 2013-11-06 MED ORDER — MEPERIDINE HCL 50 MG/ML IJ SOLN: 6.2500 mg | INTRAMUSCULAR | Status: DC | PRN

## 2013-11-06 MED ORDER — BUPIVACAINE HCL (PF) 0.25 % IJ SOLN
INTRAMUSCULAR | Status: AC
  Filled 2013-11-06: qty 30

## 2013-11-06 MED ORDER — POLYETHYLENE GLYCOL 3350 17 G PO PACK
17.0000 g | PACK | Freq: Every day | ORAL | Status: DC | PRN
  Administered 2013-11-07: 17 g via ORAL

## 2013-11-06 MED ORDER — KETOROLAC TROMETHAMINE 15 MG/ML IJ SOLN
INTRAMUSCULAR | Status: AC
  Filled 2013-11-06: qty 1

## 2013-11-06 MED ORDER — PROPOFOL 10 MG/ML IV BOLUS
INTRAVENOUS | Status: AC
  Filled 2013-11-06: qty 20

## 2013-11-06 MED ORDER — ONDANSETRON HCL 4 MG/2ML IJ SOLN
INTRAMUSCULAR | Status: AC
  Filled 2013-11-06: qty 2

## 2013-11-06 MED ORDER — METHOCARBAMOL 500 MG PO TABS
500.0000 mg | ORAL_TABLET | Freq: Four times a day (QID) | ORAL | Status: DC | PRN
  Administered 2013-11-07: 500 mg via ORAL
  Filled 2013-11-06: qty 1

## 2013-11-06 MED ORDER — SODIUM CHLORIDE 0.9 % IJ SOLN
INTRAMUSCULAR | Status: AC
  Filled 2013-11-06: qty 50

## 2013-11-06 MED ORDER — FENTANYL CITRATE 0.05 MG/ML IJ SOLN
INTRAMUSCULAR | Status: AC
  Filled 2013-11-06: qty 5

## 2013-11-06 MED ORDER — DEXAMETHASONE SODIUM PHOSPHATE 10 MG/ML IJ SOLN
INTRAMUSCULAR | Status: AC
  Filled 2013-11-06: qty 1

## 2013-11-06 MED ORDER — ACETAMINOPHEN 500 MG PO TABS
1000.0000 mg | ORAL_TABLET | Freq: Once | ORAL | Status: AC
  Administered 2013-11-06: 1000 mg via ORAL
  Filled 2013-11-06: qty 2

## 2013-11-06 MED ORDER — GLYCOPYRROLATE 0.2 MG/ML IJ SOLN
INTRAMUSCULAR | Status: AC
  Filled 2013-11-06: qty 3

## 2013-11-06 MED ORDER — LIDOCAINE HCL (CARDIAC) 20 MG/ML IV SOLN
INTRAVENOUS | Status: DC | PRN
  Administered 2013-11-06: 100 mg via INTRAVENOUS

## 2013-11-06 MED ORDER — MENTHOL 3 MG MT LOZG: 1.0000 | LOZENGE | OROMUCOSAL | Status: DC | PRN

## 2013-11-06 MED ORDER — METOCLOPRAMIDE HCL 10 MG PO TABS: 5.0000 mg | ORAL_TABLET | Freq: Three times a day (TID) | ORAL | Status: DC | PRN

## 2013-11-06 MED ORDER — LACTATED RINGERS IV SOLN
INTRAVENOUS | Status: DC | PRN
  Administered 2013-11-06: 12:00:00 via INTRAVENOUS

## 2013-11-06 MED ORDER — ROCURONIUM BROMIDE 100 MG/10ML IV SOLN
INTRAVENOUS | Status: DC | PRN
  Administered 2013-11-06: 50 mg via INTRAVENOUS

## 2013-11-06 MED ORDER — KCL IN DEXTROSE-NACL 20-5-0.9 MEQ/L-%-% IV SOLN
INTRAVENOUS | Status: DC
  Administered 2013-11-06 – 2013-11-07 (×2): via INTRAVENOUS
  Filled 2013-11-06 (×5): qty 1000

## 2013-11-06 MED ORDER — SODIUM CHLORIDE 0.9 % IV SOLN: INTRAVENOUS | Status: DC

## 2013-11-06 MED ORDER — PROMETHAZINE HCL 25 MG/ML IJ SOLN: 6.2500 mg | INTRAMUSCULAR | Status: DC | PRN

## 2013-11-06 MED ORDER — ONDANSETRON HCL 4 MG PO TABS: 4.0000 mg | ORAL_TABLET | Freq: Four times a day (QID) | ORAL | Status: DC | PRN

## 2013-11-06 MED ORDER — METOCLOPRAMIDE HCL 5 MG/ML IJ SOLN: 5.0000 mg | Freq: Three times a day (TID) | INTRAMUSCULAR | Status: DC | PRN

## 2013-11-06 MED ORDER — ZOLPIDEM TARTRATE 5 MG PO TABS
5.0000 mg | ORAL_TABLET | Freq: Every evening | ORAL | Status: DC | PRN
  Administered 2013-11-06: 5 mg via ORAL
  Filled 2013-11-06: qty 1

## 2013-11-06 SURGICAL SUPPLY — 55 items
BAG SPEC THK2 15X12 ZIP CLS (MISCELLANEOUS) ×1
BAG ZIPLOCK 12X15 (MISCELLANEOUS) ×2 IMPLANT
BIT DRILL 2.8X128 (BIT) ×2 IMPLANT
BLADE EXTENDED COATED 6.5IN (ELECTRODE) ×2 IMPLANT
BLADE SAW SAG 73X25 THK (BLADE) ×1
BLADE SAW SGTL 73X25 THK (BLADE) ×1 IMPLANT
CAPT HIP PF COP ×1 IMPLANT
CATH FOLEY 2WAY SLVR  5CC 16FR (CATHETERS) ×1
CATH FOLEY 2WAY SLVR 5CC 16FR (CATHETERS) IMPLANT
DECANTER SPIKE VIAL GLASS SM (MISCELLANEOUS) ×2 IMPLANT
DRAPE INCISE IOBAN 66X45 STRL (DRAPES) ×2 IMPLANT
DRAPE ORTHO SPLIT 77X108 STRL (DRAPES) ×4
DRAPE POUCH INSTRU U-SHP 10X18 (DRAPES) ×2 IMPLANT
DRAPE SURG ORHT 6 SPLT 77X108 (DRAPES) ×2 IMPLANT
DRAPE U-SHAPE 47X51 STRL (DRAPES) ×2 IMPLANT
DRSG ADAPTIC 3X8 NADH LF (GAUZE/BANDAGES/DRESSINGS) ×2 IMPLANT
DRSG MEPILEX BORDER 4X4 (GAUZE/BANDAGES/DRESSINGS) ×2 IMPLANT
DRSG MEPILEX BORDER 4X8 (GAUZE/BANDAGES/DRESSINGS) ×2 IMPLANT
DURAPREP 26ML APPLICATOR (WOUND CARE) ×2 IMPLANT
ELECT REM PT RETURN 9FT ADLT (ELECTROSURGICAL) ×2
ELECTRODE REM PT RTRN 9FT ADLT (ELECTROSURGICAL) ×1 IMPLANT
EVACUATOR 1/8 PVC DRAIN (DRAIN) ×2 IMPLANT
FACESHIELD LNG OPTICON STERILE (SAFETY) ×8 IMPLANT
GLOVE BIO SURGEON STRL SZ7.5 (GLOVE) ×2 IMPLANT
GLOVE BIO SURGEON STRL SZ8 (GLOVE) ×2 IMPLANT
GLOVE BIOGEL PI IND STRL 8 (GLOVE) ×3 IMPLANT
GLOVE BIOGEL PI INDICATOR 8 (GLOVE) ×3
GLOVE SURG SS PI 6.5 STRL IVOR (GLOVE) ×4 IMPLANT
GOWN PREVENTION PLUS LG XLONG (DISPOSABLE) ×4 IMPLANT
GOWN STRL REIN XL XLG (GOWN DISPOSABLE) ×2 IMPLANT
IMMOBILIZER KNEE 20 (SOFTGOODS)
IMMOBILIZER KNEE 20 THIGH 36 (SOFTGOODS) IMPLANT
IMMOBILIZER KNEE 22 UNIV (SOFTGOODS) ×1 IMPLANT
KIT BASIN OR (CUSTOM PROCEDURE TRAY) ×2 IMPLANT
MANIFOLD NEPTUNE II (INSTRUMENTS) ×2 IMPLANT
NDL SAFETY ECLIPSE 18X1.5 (NEEDLE) ×1 IMPLANT
NEEDLE HYPO 18GX1.5 SHARP (NEEDLE) ×2
NEEDLE HYPO 22GX1.5 SAFETY (NEEDLE) ×2 IMPLANT
NS IRRIG 1000ML POUR BTL (IV SOLUTION) ×2 IMPLANT
PACK TOTAL JOINT (CUSTOM PROCEDURE TRAY) ×2 IMPLANT
PASSER SUT SWANSON 36MM LOOP (INSTRUMENTS) ×2 IMPLANT
POSITIONER SURGICAL ARM (MISCELLANEOUS) ×2 IMPLANT
SPONGE GAUZE 4X4 12PLY (GAUZE/BANDAGES/DRESSINGS) ×1 IMPLANT
STRIP CLOSURE SKIN 1/2X4 (GAUZE/BANDAGES/DRESSINGS) ×3 IMPLANT
SUT ETHIBOND NAB CT1 #1 30IN (SUTURE) ×4 IMPLANT
SUT MNCRL AB 4-0 PS2 18 (SUTURE) ×2 IMPLANT
SUT VIC AB 2-0 CT1 27 (SUTURE) ×6
SUT VIC AB 2-0 CT1 TAPERPNT 27 (SUTURE) ×3 IMPLANT
SUT VLOC 180 0 24IN GS25 (SUTURE) ×4 IMPLANT
SYR 20CC LL (SYRINGE) ×2 IMPLANT
SYR 50ML LL SCALE MARK (SYRINGE) ×2 IMPLANT
TOWEL OR 17X26 10 PK STRL BLUE (TOWEL DISPOSABLE) ×4 IMPLANT
TOWEL OR NON WOVEN STRL DISP B (DISPOSABLE) ×2 IMPLANT
TRAY FOLEY CATH 14FRSI W/METER (CATHETERS) ×1 IMPLANT
WATER STERILE IRR 1500ML POUR (IV SOLUTION) ×3 IMPLANT

## 2013-11-06 NOTE — Op Note (Signed)
Pre-operative diagnosis- Osteoarthritis Right hip  Post-operative diagnosis- Osteoarthritis  Right hip  Procedure-  RightTotal Hip Arthroplasty  Surgeon- [name]. Patrick Cieslik, MD  Assistant- Patrick Peace, PA-C   Anesthesia  General  EBL- 300 ml   Drain hemovac   Complication- None  Condition-PACU - hemodynamically stable.   Brief Clinical Note- [name] is a 57 y.o. male with end stage arthritis of his right hip with progressively worsening pain and dysfunction. Pain occurs with activity and rest including pain at night. He has tried analgesics, protected weight bearing and rest without benefit. Pain is too severe to attempt physical therapy. Radiographs demonstrate bone on bone arthritis with subchondral cyst formation. He presents now for right THA.  Procedure in detail-   The patient is brought into the operating room and placed on the operating table. After successful administration of General  anesthesia, the patient is placed in the  Left lateral decubitus position with the  Right side up and held in place with the hip positioner. The lower extremity is isolated from the perineum with plastic drapes and time-out is performed by the surgical team. The lower extremity is then prepped and draped in the usual sterile fashion. A short posterolateral incision is made with a ten blade through the subcutaneous tissue to the level of the fascia lata which is incised in line with the skin incision. The sciatic nerve is palpated and protected and the short external rotators and capsule are isolated from the femur. The hip is then dislocated and the center of the femoral head is marked. A trial prosthesis is placed such that the trial head corresponds to the center of the patients' native femoral head. The resection level is marked on the femoral neck and the resection is made with an oscillating saw. The femoral head is removed and femoral retractors placed to gain access to the femoral canal.    The canal finder is passed into the femoral canal and the canal is thoroughly irrigated with sterile saline to remove the fatty contents. Axial reaming is performed to 17.5  mm, proximal reaming to 29F  and the sleeve machined to a large. A 29F large trial sleeve is placed into the proximal femur.      The femur is then retracted anteriorly to gain acetabular exposure. Acetabular retractors are placed and the labrum and osteophytes are removed, Acetabular reaming is performed to 57  mm and a 58  mm Pinnacle acetabular shell is placed in anatomic position with excellent purchase. Additional dome screws were not needed. The permanent 36 mm neutral + 4 Marathon liner is placed into the acetabular shell.      The trial femur is then placed into the femoral canal. The size is 22 x 17  stem with a 36 + 12  neck and a 36 + 6 head with the neck version 10 degrees beyond  the patients' native anteversion. The hip is reduced with excellent stability with full extension and full external rotation, 70 degrees flexion with 40 degrees adduction and 90 degrees internal rotation and 90 degrees of flexion with 70 degrees of internal rotation. The operative leg is placed on top of the non-operative leg and the leg lengths are found to be equal. The trials are then removed and the permanent implant of the same size is impacted into the femoral canal. The ceramic femoral head of the same size as the trial is placed and the hip is reduced with the same stability parameters. The operative  leg is again placed on top of the non-operative leg and the leg lengths are found to be equal.      The wound is then copiously irrigated with saline solution and the capsule and short external rotators are re-attached to the femur through drill holes with Ethibond suture. The fascia lata is closed over a hemovac drain with #1 vicryl suture and the fascia lata, gluteal muscles and subcutaneous tissues are injected with Exparel 20ml diluted with  saline 30 ml plus 20 ml of .25% Marcaine. The subcutaneous tissues are closed with #1 and2-0 vicryl and the subcuticular layer closed with running 4-0 Monocryl. The drain is hooked to suction, incision cleaned and dried, and steri-srips and a bulky sterile dressing applied. The limb is placed into a knee immobilizer and the patient is awakened and transported to recovery in stable condition.      Please note that a surgical assistant was a medical necessity for this procedure in order to perform it in a safe and expeditious manner. The assistant was necessary to provide retraction to the vital neurovascular structures and to retract and position the limb to allow for anatomic placement of the prosthetic components.  [name] Patrick Brousseau, MD    11/06/2013, 2:47 PM

## 2013-11-06 NOTE — H&P (View-Only) (Signed)
[name]  DOB: 10/27/1956 Married / Language: English / Race: White Male  Date of Admission:  11/06/2013  Chief Complaint:  Right Hip Pain  History of Present Illness The patient is a 57 year old male who comes in for a preoperative History and Physical. The patient is scheduled for a right total hip arthroplasty to be performed by Dr. Frank V. Aluisio, MD at [facility] on 11/06/2013. The patient is a 57 year old male who presents for follow up of their hip. The patient is being followed for their right hip pain and osteoarthritis. Symptoms reported today include: pain. The patient feels that they are doing poorly. The following medication has been used for pain control: none. He has been had increasing right hip pain as well as decreasing mobility over the past 2 years. He has not had an injury. He says that the right hip is not as bad a the left hip was prior to THA but that he feels that he waited too long to have the left hip replaced. The left hip is doing well at this time. Currently schedule for the right THA on 11/06/13 and he is ready to proceed with surgery. They have been treated conservatively in the past for the above stated problem and despite conservative measures, they continue to have progressive pain and severe functional limitations and dysfunction. They have failed non-operative management including home exercise, medications. It is felt that they would benefit from undergoing total joint replacement. Risks and benefits of the procedure have been discussed with the patient and they elect to proceed with surgery. There are no active contraindications to surgery such as ongoing infection or rapidly progressive neurological disease.   Problem List/Past Medical Hip osteoarthritis (715.95) Liver disease Hepatitis C   Allergies No Known Drug Allergies. 11/06/2011    Family History Cerebrovascular Accident. Father. Congestive Heart Failure.  Father.    Social History Marital status. married No history of drug/alcohol rehab Living situation. live with spouse Current work status. working full time Exercise. Exercises daily; does running / walking and gym / weights Not under pain contract Tobacco use. Never smoker. 10/08/2013 Number of flights of stairs before winded. 2-3 Tobacco / smoke exposure. 10/08/2013: no Alcohol use. Formerly drank alcohol. Children. 4 Current drinker. 10/08/2013: Currently drinks beer and wine only occasionally per week Post-Surgical Plans. Plan for home.    Medication History Not currently taking any medications Active.    Past Surgical History Total Hip Replacement. left Tonsillectomy Inguinal Hernia Repair. laparoscopic: right   Review of Systems General:Not Present- Chills, Fever, Night Sweats, Fatigue, Weight Gain, Weight Loss and Memory Loss. Skin:Not Present- Hives, Itching, Rash, Eczema and Lesions. HEENT:Not Present- Tinnitus, Headache, Double Vision, Visual Loss, Hearing Loss and Dentures. Respiratory:Not Present- Shortness of breath with exertion, Shortness of breath at rest, Allergies, Coughing up blood and Chronic Cough. Cardiovascular:Not Present- Chest Pain, Racing/skipping heartbeats, Difficulty Breathing Lying Down, Murmur, Swelling and Palpitations. Gastrointestinal:Not Present- Bloody Stool, Heartburn, Abdominal Pain, Vomiting, Nausea, Constipation, Diarrhea, Difficulty Swallowing, Jaundice and Loss of appetitie. Male Genitourinary:Not Present- Urinary frequency, Blood in Urine, Weak urinary stream, Discharge, Flank Pain, Incontinence, Painful Urination, Urgency, Urinary Retention and Urinating at Night. Musculoskeletal:Not Present- Muscle Weakness, Muscle Pain, Joint Swelling, Joint Pain, Back Pain, Morning Stiffness and Spasms. Neurological:Not Present- Tremor, Dizziness, Blackout spells, Paralysis, Difficulty with balance and  Weakness. Psychiatric:Not Present- Insomnia.    Vitals Pulse: 64 (Regular) Resp.: 14 (Unlabored) BP: 128/82 (Sitting, Left Arm, Standard)       Physical Exam The physical exam findings are as follows:   General Mental Status - Alert, cooperative and good historian. General Appearance- pleasant. Not in acute distress. Orientation- Oriented X3. Build & Nutrition- Well nourished and Well developed.   Head and Neck Head- normocephalic, atraumatic . Neck Global Assessment- supple. no bruit auscultated on the right and no bruit auscultated on the left.   Eye Pupil- Bilateral- Regular and Round. Motion- Bilateral- EOMI.   Chest and Lung Exam Auscultation: Breath sounds:- clear at anterior chest wall and - clear at posterior chest wall. Adventitious sounds:- No Adventitious sounds.   Cardiovascular Auscultation:Rhythm- Regular rate and rhythm. Heart Sounds- S1 WNL and S2 WNL. Murmurs & Other Heart Sounds:Auscultation of the heart reveals - No Murmurs.   Abdomen Palpation/Percussion:Tenderness- Abdomen is non-tender to palpation. Rigidity (guarding)- Abdomen is soft. Auscultation:Auscultation of the abdomen reveals - Bowel sounds normal.   Male Genitourinary Not done, not pertinent to present illness  Musculoskeletal Well developed male in no distress. His right hip can be flexed to 100, no internal rotation, about 10 degrees of external rotation, 20 degrees of abduction. He walks with a limp.  RADIOGRAPHS: AP pelvis, lateral right hip bone on bone arthritis of the hip with some subchondral cystic changes and large osteophytes.   Assessment & Plan Hip osteoarthritis (715.95) Impression: Right Hip  Note: Plan is for a Right Total Hip Replacement by Dr. Aluisio.  Plan is to go home.  The patient does not have any contraindications and will receive TXA (tranexamic acid) prior to surgery.  Signed electronically by  Lanise Mergen L Alga Southall, III PA-C  

## 2013-11-06 NOTE — Transfer of Care (Signed)
Immediate Anesthesia Transfer of Care Note  Patient: [name]  Procedure(s) Performed: Procedure(s) (LRB): RIGHT TOTAL HIP ARTHROPLASTY (Right)  Patient Location: PACU  Anesthesia Type: General  Level of Consciousness: sedated, patient cooperative and responds to stimulation  Airway & Oxygen Therapy: Patient Spontanous Breathing and Patient connected to face mask oxgen  Post-op Assessment: Report given to PACU RN and Post -op Vital signs reviewed and stable  Post vital signs: Reviewed and stable  Complications: No apparent anesthesia complications

## 2013-11-06 NOTE — Interval H&P Note (Signed)
History and Physical Interval Note:  11/06/2013 12:47 PM  [name]  has presented today for surgery, with the diagnosis of OA OF RIGHT HIP  The various methods of treatment have been discussed with the patient and family. After consideration of risks, benefits and other options for treatment, the patient has consented to  Procedure(s): RIGHT TOTAL HIP ARTHROPLASTY (Right) as a surgical intervention .  The patient's history has been reviewed, patient examined, no change in status, stable for surgery.  I have reviewed the patient's chart and labs.  Questions were answered to the patient's satisfaction.     Loanne Drilling

## 2013-11-06 NOTE — Anesthesia Preprocedure Evaluation (Addendum)
Anesthesia Evaluation  Patient identified by MRN, date of birth, ID band Patient awake    Reviewed: Allergy & Precautions, H&P , NPO status , Patient's Chart, lab work & pertinent test results  Airway Mallampati: II TM Distance: >3 FB Neck ROM: Full    Dental no notable dental hx.    Pulmonary neg pulmonary ROS,  breath sounds clear to auscultation  Pulmonary exam normal       Cardiovascular negative cardio ROS  Rhythm:Regular Rate:Normal     Neuro/Psych negative neurological ROS  negative psych ROS   GI/Hepatic negative GI ROS, Neg liver ROS, (+) Hepatitis -, C  Endo/Other  negative endocrine ROS  Renal/GU negative Renal ROS  negative genitourinary   Musculoskeletal negative musculoskeletal ROS (+)   Abdominal   Peds negative pediatric ROS (+)  Hematology negative hematology ROS (+)   Anesthesia Other Findings   Reproductive/Obstetrics negative OB ROS                           Anesthesia Physical Anesthesia Plan  ASA: II  Anesthesia Plan: General   Post-op Pain Management:    Induction: Intravenous  Airway Management Planned: Oral ETT  Additional Equipment:   Intra-op Plan:   Post-operative Plan: Extubation in OR  Informed Consent: I have reviewed the patients History and Physical, chart, labs and discussed the procedure including the risks, benefits and alternatives for the proposed anesthesia with the patient or authorized representative who has indicated his/her understanding and acceptance.   Dental advisory given  Plan Discussed with: CRNA  Anesthesia Plan Comments: (Pt had previous hip done under GA. Requests GA again. SAB explained. He declines)        Anesthesia Quick Evaluation

## 2013-11-06 NOTE — Preoperative (Signed)
Beta Blockers   Reason not to administer Beta Blockers:Not Applicable 

## 2013-11-07 LAB — CBC
HCT: 37.7 % — ABNORMAL LOW (ref 39.0–52.0)
Hemoglobin: 13.5 g/dL (ref 13.0–17.0)
MCH: 32.7 pg (ref 26.0–34.0)
MCHC: 35.8 g/dL (ref 30.0–36.0)
MCV: 91.3 fL (ref 78.0–100.0)
Platelets: 156 10*3/uL (ref 150–400)
RBC: 4.13 MIL/uL — ABNORMAL LOW (ref 4.22–5.81)
RDW: 12.2 % (ref 11.5–15.5)
WBC: 9.7 10*3/uL (ref 4.0–10.5)

## 2013-11-07 LAB — BASIC METABOLIC PANEL
BUN: 14 mg/dL (ref 6–23)
CO2: 24 mEq/L (ref 19–32)
Calcium: 8.2 mg/dL — ABNORMAL LOW (ref 8.4–10.5)
Chloride: 100 mEq/L (ref 96–112)
Creatinine, Ser: 0.89 mg/dL (ref 0.50–1.35)
GFR calc Af Amer: >90 mL/min (ref >90)
GFR calc non Af Amer: >90 mL/min (ref >90)
Glucose, Bld: 149 mg/dL — ABNORMAL HIGH (ref 70–99)
Potassium: 3.9 mEq/L (ref 3.5–5.1)
Sodium: 132 mEq/L — ABNORMAL LOW (ref 135–145)

## 2013-11-07 MED ORDER — OXYCODONE HCL 5 MG PO TABS: 5.0000 mg | ORAL_TABLET | ORAL | Status: DC | PRN

## 2013-11-07 MED ORDER — RIVAROXABAN 10 MG PO TABS: 10.0000 mg | ORAL_TABLET | Freq: Every day | ORAL | Status: DC

## 2013-11-07 MED ORDER — ZOLPIDEM TARTRATE 10 MG PO TABS: 10.0000 mg | ORAL_TABLET | Freq: Every evening | ORAL | Status: AC | PRN

## 2013-11-07 MED ORDER — TRAMADOL HCL 50 MG PO TABS: 50.0000 mg | ORAL_TABLET | Freq: Four times a day (QID) | ORAL | Status: DC | PRN

## 2013-11-07 MED ORDER — METHOCARBAMOL 500 MG PO TABS: 500.0000 mg | ORAL_TABLET | Freq: Four times a day (QID) | ORAL | Status: DC | PRN

## 2013-11-07 MED ORDER — ZOLPIDEM TARTRATE 10 MG PO TABS: 10.0000 mg | ORAL_TABLET | Freq: Every evening | ORAL | Status: DC | PRN

## 2013-11-07 NOTE — Progress Notes (Signed)
   Subjective: 1 Day Post-Op Procedure(s) (LRB): RIGHT TOTAL HIP ARTHROPLASTY (Right) Patient reports pain as mild.   We will start therapy today.  Plan is to go Home after hospital stay.  Objective: Vital signs in last 24 hours: Temp:  [97.6 F (36.4 C)-98.7 F (37.1 C)] 98.4 F (36.9 C) (12/13 0500) Pulse Rate:  [64-77] 74 (12/13 0500) Resp:  [13-21] 16 (12/13 0500) BP: (125-177)/(63-92) 128/82 mmHg (12/13 0500) SpO2:  [91 %-100 %] 97 % (12/13 0500) Weight:  [314 lb (142.429 kg)] 314 lb (142.429 kg) (12/12 1636)  Intake/Output from previous day:  Intake/Output Summary (Last 24 hours) at 11/07/13 0824 Last data filed at 11/07/13 0647  Gross per 24 hour  Intake 4868.34 ml  Output   2340 ml  Net 2528.34 ml    Intake/Output this shift:    Labs:  Recent Labs  11/07/13 0443  HGB 13.5    Recent Labs  11/07/13 0443  WBC 9.7  RBC 4.13*  HCT 37.7*  PLT 156    Recent Labs  11/07/13 0443  NA 132*  K 3.9  CL 100  CO2 24  BUN 14  CREATININE 0.89  GLUCOSE 149*  CALCIUM 8.2*   No results found for this basename: LABPT, INR,  in the last 72 hours  EXAM General - Patient is Alert, Appropriate and Oriented Extremity - Neurologically intact Neurovascular intact No cellulitis present Compartment soft Dressing - dressing C/D/I Motor Function - intact, moving foot and toes well on exam.  Hemovac pulled without difficulty.  Past Medical History  Diagnosis Date  . Arthritis   . Hepatitis C   . Personal history of adenomatous colonic polyps 08/05/2012    8 mm tubulovillous adenoma 2010 08/05/2012 no polyps - next routine exam 2018    . Pneumonia age 56    hx of    Assessment/Plan: 1 Day Post-Op Procedure(s) (LRB): RIGHT TOTAL HIP ARTHROPLASTY (Right) Principal Problem:   OA (osteoarthritis) of hip   Advance diet Up with therapy D/C IV fluids Plan for discharge tomorrow  DVT Prophylaxis - Xarelto Weight Bearing As Tolerated right Leg D/C Knee  Immobilizer Hemovac Pulled Begin Therapy Hip Preacutions    [name]

## 2013-11-07 NOTE — Progress Notes (Signed)
Physical Therapy Treatment Patient Details Name: [name] MRN: [id number] DOB: 1956/09/20 Today's Date: 11/07/2013 Time: 7829-5621 PT Time Calculation (min): 20 min  PT Assessment / Plan / Recommendation  History of Present Illness s/p R THA with hip precations   PT Comments     Follow Up Recommendations  Home health PT     Does the patient have the potential to tolerate intense rehabilitation     Barriers to Discharge        Equipment Recommendations  Rolling walker with 5" wheels (WIDE RW PLEASE)    Recommendations for Other Services OT consult  Frequency 7X/week   Progress towards PT Goals Progress towards PT goals: Progressing toward goals  Plan Current plan remains appropriate    Precautions / Restrictions Precautions Precautions: Posterior Hip;Fall Precaution Comments: reviewed precautions with pt Restrictions Weight Bearing Restrictions: No Other Position/Activity Restrictions: WBAT   Pertinent Vitals/Pain 5/10; premed, ice pack provided.    Mobility  Transfers Transfers: Sit to Stand Sit to Stand: 4: Min guard Details for Transfer Assistance: cues for LE management and use of UEs to self assist Ambulation/Gait Ambulation/Gait Assistance: 4: Min guard;5: Supervision Ambulation Distance (Feet): 400 Feet Assistive device: Rolling walker Ambulation/Gait Assistance Details: min cues for posture, position from RW and ER oN R Gait Pattern: Step-to pattern;Step-through pattern;Decreased step length - right;Decreased step length - left;Shuffle;Antalgic General Gait Details: progressed to recip gait Stairs: No    Exercises     PT Diagnosis:    PT Problem List:   PT Treatment Interventions:     PT Goals (current goals can now be found in the care plan section) Acute Rehab PT Goals Patient Stated Goal: Resume previous lifestyle with decreased pain PT Goal Formulation: With patient Time For Goal Achievement: 11/12/13 Potential to Achieve Goals:  Good  Visit Information  Last PT Received On: 11/07/13 Assistance Needed: +1 History of Present Illness: s/p R THA with hip precations    Subjective Data  Patient Stated Goal: Resume previous lifestyle with decreased pain   Cognition  Cognition Arousal/Alertness: Awake/alert Behavior During Therapy: WFL for tasks assessed/performed Overall Cognitive Status: Within Functional Limits for tasks assessed    Balance     End of Session PT - End of Session Equipment Utilized During Treatment: Gait belt Activity Tolerance: Patient tolerated treatment well Patient left: in chair;with call bell/phone within reach Nurse Communication: Mobility status   GP     Socrates Cahoon 11/07/2013, 4:59 PM

## 2013-11-07 NOTE — Progress Notes (Signed)
Occupational Therapy Evaluation Patient Details Name: [name] MRN: [id number] DOB: [birth year] Today's Date: 11/07/2013 Time: 4098-1191 OT Time Calculation (min): 9 min  OT Assessment / Plan / Recommendation History of present illness s/p R THA with hip precations   Clinical Impression   Patient politely declining to practice ADLs/ADL transfers. Needs wide 3 in 1 for discharge home. OT will sign off.    OT Assessment  Patient does not need any further OT services    Follow Up Recommendations  No OT follow up;Supervision/Assistance - 24 hour    Barriers to Discharge      Equipment Recommendations  3 in 1 bedside comode (needs wide BSC)    Recommendations for Other Services    Frequency       Precautions / Restrictions Precautions Precautions: Posterior Hip;Fall Precaution Comments: reviewed precautions with pt Restrictions Weight Bearing Restrictions: No Other Position/Activity Restrictions: WBAT   Pertinent Vitals/Pain No c/o    ADL  Transfers/Ambulation Related to ADLs: Patient declined practicing toilet or shower transfers with OT. He states he will have wife assist him as needed. Requests a wide 3 in 1 for home. ADL Comments: Patient declined practicing LB self-care. Has reacher at home. Declines other AE at this time. States wife will assist him with LB self-care due to hip precautions.    OT Diagnosis:    OT Problem List:   OT Treatment Interventions:     OT Goals(Current goals can be found in the care plan section) Acute Rehab OT Goals Patient Stated Goal: Resume previous lifestyle with decreased pain  Visit Information  Last OT Received On: 11/07/13 Assistance Needed: +1 History of Present Illness: s/p R THA with hip precations       Prior Functioning     Home Living Family/patient expects to be discharged to:: Private residence Living Arrangements: Spouse/significant other Available Help at Discharge: Family Type of Home: House Home Access:  Stairs to enter Secretary/administrator of Steps: 2 Entrance Stairs-Rails: None Home Layout: One level Home Equipment: Crutches;Adaptive equipment Lexicographer) Additional Comments: Pt's spouse is checking on RW availability Prior Function Level of Independence: Independent Communication Communication: No difficulties Dominant Hand: Right         Vision/Perception Vision - History Baseline Vision: No visual deficits   Cognition  Cognition Arousal/Alertness: Awake/alert Behavior During Therapy: WFL for tasks assessed/performed Overall Cognitive Status: Within Functional Limits for tasks assessed    Extremity/Trunk Assessment Upper Extremity Assessment Upper Extremity Assessment: Overall WFL for tasks assessed Lower Extremity Assessment Lower Extremity Assessment: Defer to PT evaluation RLE Deficits / Details: 2/5 hip strength with AAROM at hip to 85 flex and 20 abd     Balance     End of Session OT - End of Session Activity Tolerance: Patient tolerated treatment well Patient left: in chair;with call bell/phone within reach  GO     Tommi Crepeau A 11/07/2013, 1:20 PM

## 2013-11-07 NOTE — Progress Notes (Addendum)
Pt has voided total of 300 cc since foley d/c'd. Bladder scan = 179 cc. Page placed to [facility], [place]. Call back received & info acknowledged. Jacorey Donaway, [employer]

## 2013-11-07 NOTE — Evaluation (Signed)
Physical Therapy Evaluation Patient Details Name: [name] MRN: [id number] DOB: 1956/05/30 Today's Date: 11/07/2013 Time: 4098-1191 PT Time Calculation (min): 40 min  PT Assessment / Plan / Recommendation History of Present Illness     Clinical Impression  Pt s/p R THR presents with decreased R LE strength/ROM, post op pain and post THP limiting functional mobility.  Pt should progress to d/c home with family assist and HHPT follow up.    PT Assessment  Patient needs continued PT services    Follow Up Recommendations  Home health PT    Does the patient have the potential to tolerate intense rehabilitation      Barriers to Discharge        Equipment Recommendations       Recommendations for Other Services OT consult   Frequency 7X/week    Precautions / Restrictions Precautions Precautions: Posterior Hip;Fall Precaution Comments: sign hung on wall - precautions reviewed Restrictions Weight Bearing Restrictions: No Other Position/Activity Restrictions: WBAT   Pertinent Vitals/Pain 5-6/10; premed, ice pack provided      Mobility  Bed Mobility Bed Mobility: Supine to Sit Supine to Sit: 4: Min assist Details for Bed Mobility Assistance: cues for sequence and use of L LE to self assist Transfers Transfers: Sit to Stand;Stand to Sit Sit to Stand: 4: Min assist Stand to Sit: 4: Min assist Details for Transfer Assistance: cues for LE management and use of UEs to self assist Ambulation/Gait Ambulation/Gait Assistance: 4: Min assist Ambulation Distance (Feet): 75 Feet Assistive device: Rolling walker Ambulation/Gait Assistance Details: cues for sequence, posture, stride length and position from RW Gait Pattern: Step-to pattern;Shuffle;Antalgic    Exercises Total Joint Exercises Ankle Circles/Pumps: AROM;15 reps;Supine;Both Quad Sets: AROM;Both;10 reps;Supine Heel Slides: AAROM;Right;15 reps;Supine Hip ABduction/ADduction: AAROM;Right;15 reps;Supine   PT  Diagnosis: Difficulty walking  PT Problem List: Decreased strength;Decreased range of motion;Decreased activity tolerance;Decreased mobility;Decreased knowledge of use of DME;Obesity;Pain;Decreased knowledge of precautions PT Treatment Interventions: DME instruction;Gait training;Stair training;Functional mobility training;Therapeutic activities;Therapeutic exercise;Patient/family education     PT Goals(Current goals can be found in the care plan section) Acute Rehab PT Goals Patient Stated Goal: Resume previous lifestyle with decreased pain PT Goal Formulation: With patient Time For Goal Achievement: 11/12/13 Potential to Achieve Goals: Good  Visit Information  Last PT Received On: 11/07/13 Assistance Needed: +1       Prior Functioning  Home Living Family/patient expects to be discharged to:: Private residence Living Arrangements: Spouse/significant other Available Help at Discharge: Family Type of Home: House Home Access: Stairs to enter Secretary/administrator of Steps: 2 Entrance Stairs-Rails: None Home Layout: One level Home Equipment: Crutches Additional Comments: Pt's spouse is checking on RW availability Prior Function Level of Independence: Independent Communication Communication: No difficulties Dominant Hand: Right    Cognition  Cognition Arousal/Alertness: Awake/alert Behavior During Therapy: WFL for tasks assessed/performed Overall Cognitive Status: Within Functional Limits for tasks assessed    Extremity/Trunk Assessment Upper Extremity Assessment Upper Extremity Assessment: Overall WFL for tasks assessed Lower Extremity Assessment Lower Extremity Assessment: RLE deficits/detail RLE Deficits / Details: 2/5 hip strength with AAROM at hip to 85 flex and 20 abd   Balance    End of Session PT - End of Session Equipment Utilized During Treatment: Gait belt Activity Tolerance: Patient tolerated treatment well Patient left: in chair;with call bell/phone within  reach Nurse Communication: Mobility status  GP     [name] 11/07/2013, 1:06 PM

## 2013-11-08 LAB — BASIC METABOLIC PANEL
BUN: 11 mg/dL (ref 6–23)
CO2: 26 mEq/L (ref 19–32)
Calcium: 8.2 mg/dL — ABNORMAL LOW (ref 8.4–10.5)
Chloride: 100 mEq/L (ref 96–112)
Creatinine, Ser: 0.77 mg/dL (ref 0.50–1.35)
GFR calc Af Amer: >90 mL/min (ref >90)
GFR calc non Af Amer: >90 mL/min (ref >90)
Glucose, Bld: 136 mg/dL — ABNORMAL HIGH (ref 70–99)
Potassium: 3.8 mEq/L (ref 3.5–5.1)
Sodium: 132 mEq/L — ABNORMAL LOW (ref 135–145)

## 2013-11-08 LAB — CBC
HCT: 35.3 % — ABNORMAL LOW (ref 39.0–52.0)
Hemoglobin: 12.5 g/dL — ABNORMAL LOW (ref 13.0–17.0)
MCH: 32.7 pg (ref 26.0–34.0)
MCHC: 35.4 g/dL (ref 30.0–36.0)
MCV: 92.4 fL (ref 78.0–100.0)
Platelets: 148 10*3/uL — ABNORMAL LOW (ref 150–400)
RBC: 3.82 MIL/uL — ABNORMAL LOW (ref 4.22–5.81)
RDW: 12.3 % (ref 11.5–15.5)
WBC: 8.8 10*3/uL (ref 4.0–10.5)

## 2013-11-08 NOTE — Progress Notes (Signed)
   Subjective: 2 Days Post-Op Procedure(s) (LRB): RIGHT TOTAL HIP ARTHROPLASTY (Right)  Pt doing well Mild soreness but pt ready for d/c home Denies any new issues or symptoms Patient reports pain as mild.  Objective:   VITALS:   Filed Vitals:   11/08/13 0535  BP: 133/88  Pulse: 71  Temp: 98 F (36.7 C)  Resp: 16    Right hip incision healing well nv intact distally Dressing changed No rashes or edema  LABS  Recent Labs  11/07/13 0443 11/08/13 0506  HGB 13.5 12.5*  HCT 37.7* 35.3*  WBC 9.7 8.8  PLT 156 148*     Recent Labs  11/07/13 0443 11/08/13 0506  NA 132* 132*  K 3.9 3.8  BUN 14 11  CREATININE 0.89 0.77  GLUCOSE 149* 136*     Assessment/Plan: 2 Days Post-Op Procedure(s) (LRB): RIGHT TOTAL HIP ARTHROPLASTY (Right)  D/c home with therapy Pain under control F/u with Dr. Lequita Halt in 2 weeks   Alphonsa Overall, MPAS, PA-C  11/08/2013, 7:38 AM

## 2013-11-08 NOTE — Progress Notes (Signed)
   CARE MANAGEMENT NOTE 11/08/2013  Patient:  MERRIT, FRIESEN   Account Number:  [id number]  Date Initiated:  11/08/2013  Documentation initiated by:  [facility]  Subjective/Objective Assessment:     Action/Plan:   Anticipated DC Date:  11/08/2013   Anticipated DC Plan:  HOME W HOME HEALTH SERVICES      DC Planning Services  CM consult      [facility] Choice  HOME HEALTH   Choice offered to / List presented to:  C-1 Patient   DME arranged  3-N-1  Levan Hurst      DME agency  [facility]     HH arranged  HH-2 PT      [facility] agency  [facility]   Status of service:  Completed, signed off Medicare Important Message given?   (If response is "NO", the following Medicare IM given date fields will be blank) Date Medicare IM given:   Date Additional Medicare IM given:    Discharge Disposition:  HOME W HOME HEALTH SERVICES  Per UR Regulation:    If discussed at Long Length of Stay Meetings, dates discussed:    Comments:  11/08/2013 1045 NCM spoke to pt and offered [facility] list. Pt requested [facility] for HH. Contacted [facility] for DME for home. Isidoro Donning RN CCM Case Mgmt phone [phone]

## 2013-11-08 NOTE — Plan of Care (Signed)
Problem: Discharge Progression Outcomes Goal: Anticoagulant follow-up in place Outcome: Not Applicable Date Met:  11/08/13 xarelto

## 2013-11-08 NOTE — Progress Notes (Signed)
Physical Therapy Treatment Patient Details Name: [name] MRN: [id number] DOB: Apr 28, 1956 Today's Date: 11/08/2013 Time: 7829-5621 PT Time Calculation (min): 23 min  PT Assessment / Plan / Recommendation  History of Present Illness s/p R THA with hip precations   PT Comments   **Pt has met PT goals. He ambulates independently with RW, stair training completed, progressing well with THA exercises. REady to DC home from PT standpoint.*  Follow Up Recommendations  Home health PT     Does the patient have the potential to tolerate intense rehabilitation     Barriers to Discharge        Equipment Recommendations  Rolling walker with 5" wheels;3in1 (PT) (WIDE RW PLEASE)    Recommendations for Other Services OT consult  Frequency 7X/week   Progress towards PT Goals Progress towards PT goals: Goals met/education completed, patient discharged from PT  Plan Current plan remains appropriate    Precautions / Restrictions Precautions Precautions: Posterior Hip;Fall Precaution Booklet Issued: Yes (comment) Precaution Comments: reviewed precautions with pt Restrictions Weight Bearing Restrictions: No Other Position/Activity Restrictions: WBAT   Pertinent Vitals/Pain *6/10 R hip with walking RN notified, meds requested, ice applied**    Mobility  Transfers Transfers: Sit to Stand Sit to Stand: 6: Modified independent (Device/Increase time);From chair/3-in-1;With upper extremity assist Stand to Sit: 6: Modified independent (Device/Increase time);To chair/3-in-1;With upper extremity assist Ambulation/Gait Ambulation/Gait Assistance: 6: Modified independent (Device/Increase time) Ambulation Distance (Feet): 550 Feet Assistive device: Rolling walker Gait Pattern: Step-through pattern;Antalgic General Gait Details: steady, no LOB Stairs: Yes Stairs Assistance: 5: Supervision Stairs Assistance Details (indicate cue type and reason): VCs for sequencing Stair Management Technique:  No rails;Forwards;With crutches Number of Stairs: 4    Exercises Total Joint Exercises Ankle Circles/Pumps: AROM;15 reps;Supine;Both Quad Sets: AROM;Both;10 reps;Supine Heel Slides: AAROM;Right;Supine;10 reps Hip ABduction/ADduction: AAROM;Right;Supine;10 reps Long Arc Quad: AROM;Right;10 reps;Seated   PT Diagnosis:    PT Problem List:   PT Treatment Interventions:     PT Goals (current goals can now be found in the care plan section) Acute Rehab PT Goals Patient Stated Goal: Resume previous lifestyle with decreased pain PT Goal Formulation: With patient Time For Goal Achievement: 11/12/13 Potential to Achieve Goals: Good  Visit Information  Last PT Received On: 11/08/13 Assistance Needed: +1 History of Present Illness: s/p R THA with hip precations    Subjective Data  Patient Stated Goal: Resume previous lifestyle with decreased pain   Cognition  Cognition Arousal/Alertness: Awake/alert Behavior During Therapy: WFL for tasks assessed/performed Overall Cognitive Status: Within Functional Limits for tasks assessed    Balance     End of Session PT - End of Session Equipment Utilized During Treatment: Gait belt Activity Tolerance: Patient tolerated treatment well Patient left: in chair;with call bell/phone within reach Nurse Communication: Mobility status   GP     Ralene Bathe Kistler 11/08/2013, 9:15 AM [phone]

## 2013-11-08 NOTE — Discharge Summary (Signed)
Physician Discharge Summary   Patient ID: [name] MRN: [id number] DOB/AGE: [birth year] 57 y.o.  Admit date: 11/06/2013 Discharge date: 11/08/2013  Admission Diagnoses:  Principal Problem:   OA (osteoarthritis) of hip   Discharge Diagnoses:  Same   Surgeries: Procedure(s): RIGHT TOTAL HIP ARTHROPLASTY on 11/06/2013   Consultants: Pt/Ot  Discharged Condition: Stable  Hospital Course: [name] is an 57 y.o. male who was admitted 11/06/2013 with a chief complaint of No chief complaint on file. , and found to have a diagnosis of OA (osteoarthritis) of hip.  They were brought to the operating room on 11/06/2013 and underwent the above named procedures.    The patient had an uncomplicated hospital course and was stable for discharge.  Recent vital signs:  Filed Vitals:   11/08/13 0535  BP: 133/88  Pulse: 71  Temp: 98 F (36.7 C)  Resp: 16    Recent laboratory studies:  Results for orders placed during the hospital encounter of 11/06/13  CBC      Result Value Range   WBC 9.7  4.0 - 10.5 K/uL   RBC 4.13 (*) 4.22 - 5.81 MIL/uL   Hemoglobin 13.5  13.0 - 17.0 g/dL   HCT 40.1 (*) 02.7 - 25.3 %   MCV 91.3  78.0 - 100.0 fL   MCH 32.7  26.0 - 34.0 pg   MCHC 35.8  30.0 - 36.0 g/dL   RDW 66.4  40.3 - 47.4 %   Platelets 156  150 - 400 K/uL  BASIC METABOLIC PANEL      Result Value Range   Sodium 132 (*) 135 - 145 mEq/L   Potassium 3.9  3.5 - 5.1 mEq/L   Chloride 100  96 - 112 mEq/L   CO2 24  19 - 32 mEq/L   Glucose, Bld 149 (*) 70 - 99 mg/dL   BUN 14  6 - 23 mg/dL   Creatinine, Ser 2.59  0.50 - 1.35 mg/dL   Calcium 8.2 (*) 8.4 - 10.5 mg/dL   GFR calc non Af Amer >90  >90 mL/min   GFR calc Af Amer >90  >90 mL/min  CBC      Result Value Range   WBC 8.8  4.0 - 10.5 K/uL   RBC 3.82 (*) 4.22 - 5.81 MIL/uL   Hemoglobin 12.5 (*) 13.0 - 17.0 g/dL   HCT 56.3 (*) 87.5 - 64.3 %   MCV 92.4  78.0 - 100.0 fL   MCH 32.7  26.0 - 34.0 pg   MCHC 35.4  30.0 - 36.0 g/dL   RDW  32.9  51.8 - 84.1 %   Platelets 148 (*) 150 - 400 K/uL  BASIC METABOLIC PANEL      Result Value Range   Sodium 132 (*) 135 - 145 mEq/L   Potassium 3.8  3.5 - 5.1 mEq/L   Chloride 100  96 - 112 mEq/L   CO2 26  19 - 32 mEq/L   Glucose, Bld 136 (*) 70 - 99 mg/dL   BUN 11  6 - 23 mg/dL   Creatinine, Ser 6.60  0.50 - 1.35 mg/dL   Calcium 8.2 (*) 8.4 - 10.5 mg/dL   GFR calc non Af Amer >90  >90 mL/min   GFR calc Af Amer >90  >90 mL/min    Discharge Medications:     Medication List    STOP taking these medications       ibuprofen 200 MG tablet  Commonly known as:  ADVIL,MOTRIN  TAKE these medications       methocarbamol 500 MG tablet  Commonly known as:  ROBAXIN  Take 1 tablet (500 mg total) by mouth every 6 (six) hours as needed for muscle spasms.     oxyCODONE 5 MG immediate release tablet  Commonly known as:  Oxy IR/ROXICODONE  Take 1-4 tablets (5-20 mg total) by mouth every 3 (three) hours as needed for breakthrough pain.     rivaroxaban 10 MG Tabs tablet  Commonly known as:  XARELTO  Take 1 tablet (10 mg total) by mouth daily with breakfast.     traMADol 50 MG tablet  Commonly known as:  ULTRAM  Take 1-2 tablets (50-100 mg total) by mouth every 6 (six) hours as needed for moderate pain.     zolpidem 10 MG tablet  Commonly known as:  AMBIEN  Take 1 tablet (10 mg total) by mouth at bedtime as needed for sleep.        Diagnostic Studies: Dg Hip Complete Right  10/29/2013   CLINICAL DATA:  Osteoarthritis in the right hip. Preop for total hip replacement on 11/06/2013.  EXAM: RIGHT HIP - COMPLETE 2+ VIEW  COMPARISON:  None  FINDINGS: The patient has had previous left hip total arthroplasty. There is significant degenerative change involving the right hip. There is significant joint space narrowing, osteophyte formation. Geodes are present. No evidence for acute fracture. No suspicious lytic or blastic lesions identified.  IMPRESSION: 1. Postoperative changes in the  left hip. 2. Significant degenerative changes of the right hip.   Electronically Signed   By: Rosalie Gums M.D.   On: 10/29/2013 16:01   Dg Pelvis Portable  11/06/2013   CLINICAL DATA:  Total right hip arthroplasty.  EXAM: PORTABLE PELVIS 1-2 VIEWS  COMPARISON:  None.  FINDINGS: Patient is status post total right hip arthroplasty. Surgical drains identified. Hardware appears intact. Native osseous structures grossly unremarkable. Total left hip arthroplasty is identified. Hardware appears intact.  IMPRESSION: Patient status post total right hip arthroplasty.   Electronically Signed   By: Salome Holmes M.D.   On: 11/06/2013 15:58    Disposition:       Discharge Orders   Future Orders Complete By Expires   Call MD / Call 911  As directed    Comments:     If you experience chest pain or shortness of breath, CALL 911 and be transported to the hospital emergency room.  If you develope a fever above 101 F, pus (white drainage) or increased drainage or redness at the wound, or calf pain, call your surgeon's office.   Constipation Prevention  As directed    Comments:     Drink plenty of fluids.  Prune juice may be helpful.  You may use a stool softener, such as Colace (over the counter) 100 mg twice a day.  Use MiraLax (over the counter) for constipation as needed.   Diet - low sodium heart healthy  As directed    Increase activity slowly as tolerated  As directed       Follow-up Information   Follow up with Loanne Drilling, MD. Schedule an appointment as soon as possible for a visit on 11/24/2013. (Call [phone] Monday to make the appointment)    Specialty:  Orthopedic Surgery   Contact information:   [address] [place] [address] [phone]        Signed: Thea Gist 11/08/2013, 7:40 AM

## 2013-11-08 NOTE — Progress Notes (Signed)
Discharged from floor via w/c, wife with pt. No changes in assessment. [name]  

## 2013-11-09 NOTE — Anesthesia Postprocedure Evaluation (Signed)
  Anesthesia Post-op Note  Patient: [name]  Procedure(s) Performed: Procedure(s) (LRB): RIGHT TOTAL HIP ARTHROPLASTY (Right)  Patient Location: PACU  Anesthesia Type: General  Level of Consciousness: awake and alert   Airway and Oxygen Therapy: Patient Spontanous Breathing  Post-op Pain: mild  Post-op Assessment: Post-op Vital signs reviewed, Patient's Cardiovascular Status Stable, Respiratory Function Stable, Patent Airway and No signs of Nausea or vomiting  Last Vitals:  Filed Vitals:   11/08/13 0535  BP: 133/88  Pulse: 71  Temp: 36.7 C  Resp: 16    Post-op Vital Signs: stable   Complications: No apparent anesthesia complications

## 2014-06-18 ENCOUNTER — Telehealth: Payer: Self-pay | Admitting: Internal Medicine

## 2014-06-18 NOTE — Telephone Encounter (Signed)
Left message for patient to call back  

## 2014-06-18 NOTE — Telephone Encounter (Signed)
Patient with a history of Hep C he would like to be referred for treatment.  He will come in and discuss with Dr. Leone PayorGessner on 07/02/14

## 2014-07-02 ENCOUNTER — Ambulatory Visit (INDEPENDENT_AMBULATORY_CARE_PROVIDER_SITE_OTHER): Payer: BC Managed Care – PPO | Admitting: Internal Medicine

## 2014-07-02 ENCOUNTER — Other Ambulatory Visit (INDEPENDENT_AMBULATORY_CARE_PROVIDER_SITE_OTHER): Payer: BC Managed Care – PPO

## 2014-07-02 ENCOUNTER — Encounter: Payer: Self-pay | Admitting: Internal Medicine

## 2014-07-02 VITALS — BP 134/90 | HR 92 | Ht 76.0 in | Wt 300.0 lb

## 2014-07-02 DIAGNOSIS — B182 Chronic viral hepatitis C: Secondary | ICD-10-CM

## 2014-07-02 LAB — CBC WITH DIFFERENTIAL/PLATELET
Basophils Absolute: 0 10*3/uL (ref 0.0–0.1)
Basophils Relative: 0.5 % (ref 0.0–3.0)
Eosinophils Absolute: 0.1 10*3/uL (ref 0.0–0.7)
Eosinophils Relative: 1.6 % (ref 0.0–5.0)
HCT: 47.2 % (ref 39.0–52.0)
Hemoglobin: 16.4 g/dL (ref 13.0–17.0)
Lymphocytes Relative: 21.6 % (ref 12.0–46.0)
Lymphs Abs: 1.4 10*3/uL (ref 0.7–4.0)
MCHC: 34.7 g/dL (ref 30.0–36.0)
MCV: 96.4 fl (ref 78.0–100.0)
Monocytes Absolute: 0.6 10*3/uL (ref 0.1–1.0)
Monocytes Relative: 9.8 % (ref 3.0–12.0)
Neutro Abs: 4.4 10*3/uL (ref 1.4–7.7)
Neutrophils Relative %: 66.5 % (ref 43.0–77.0)
Platelets: 191 10*3/uL (ref 150.0–400.0)
RBC: 4.9 Mil/uL (ref 4.22–5.81)
RDW: 12.8 % (ref 11.5–15.5)
WBC: 6.6 10*3/uL (ref 4.0–10.5)

## 2014-07-02 LAB — COMPREHENSIVE METABOLIC PANEL
ALT: 65 U/L — ABNORMAL HIGH (ref 0–53)
AST: 22 U/L (ref 0–37)
Albumin: 4.2 g/dL (ref 3.5–5.2)
Alkaline Phosphatase: 83 U/L (ref 39–117)
BUN: 15 mg/dL (ref 6–23)
CO2: 28 mEq/L (ref 19–32)
Calcium: 9.5 mg/dL (ref 8.4–10.5)
Chloride: 101 mEq/L (ref 96–112)
Creatinine, Ser: 1.1 mg/dL (ref 0.4–1.5)
GFR: 77.53 mL/min (ref >60.00)
Glucose, Bld: 97 mg/dL (ref 70–99)
Potassium: 4.1 mEq/L (ref 3.5–5.1)
Sodium: 136 mEq/L (ref 135–145)
Total Bilirubin: 1.6 mg/dL — ABNORMAL HIGH (ref 0.2–1.2)
Total Protein: 7.7 g/dL (ref 6.0–8.3)

## 2014-07-02 NOTE — Assessment & Plan Note (Signed)
Tried interferon and ribavirin combo treatment many years ago without success due to side effects.  Will need work up and referral to Infectious Disease Clinic. Will order CMET, CBC, Protime/INR, Hep C quantitative RNA, Hep C genotype, Hep B surface antigen, antibody, and total core antibody, Hep A total antibody, and Complete ABD Ultrasound with Hepatic elastography.

## 2014-07-02 NOTE — Patient Instructions (Addendum)
Your physician has requested that you go to the basement for lab work before leaving today.  You have been scheduled [facility] an abdominal [facility]14 at 9:00am. Please arrive 15 minutes prior to your appointment for registration. Make certain not to have anything to eat or drink after midnight. Should you need to reschedule your appointment, please contact radiology at [phone]. This test typically takes about 30 minutes to perform.  I appreciate the opportunity to care for you.

## 2014-07-02 NOTE — Progress Notes (Signed)
[facility]  Jarryn Altland    [id number]    1956-03-18    Assessment and Plan/Recommendations:  1. Hepatitis C, Chronic: Indeterminate cause.  Tried interferon and ribavirin combo treatment many years ago without success due to side effects.  Will need work up and referral to Infectious Disease Clinic. Will order CMET, CBC, Protime/INR, Hep C quantitative RNA, Hep C genotype, Hep B surface antigen, antibody, and total core antibody, Hep A total antibody, and Complete ABD Ultrasound with Hepatic elastography. 2. History of polyps:  Will need follow up Colonoscopy in 2018.  HPI: [name] is a 58 y.o. white male with a history of chronic hep C.  He was diagnosed 15-16 years ago with no determined cause.  He denies IV drug use and the diagnosis was incidentally discovered during a blood donation. He has chronic fatigue but otherwise denies symptoms.  He occasionally has alcohol (2-3 beers per week) and does no take tylenol.  He denies fever, chills, N/V, diarrhea, ABD pain, and bowel or bladder changes. He comes today for evaluation and referral for treatment.    Outpatient Encounter Prescriptions as of 07/02/2014  Medication Sig  . temazepam (RESTORIL) 30 MG capsule     Allergies as of 07/02/2014  . (No Known Allergies)    Past Medical History  Diagnosis Date  . Arthritis   . Hepatitis C   . Personal history of adenomatous colonic polyps 08/05/2012    8 mm tubulovillous adenoma 2010 08/05/2012 no polyps - next routine exam 2018    . Pneumonia age 9    hx of    Past Surgical History  Procedure Laterality Date  . Total hip arthroplasty  2006    left  . Umbilical hernia repair  2010    right  . Tonsillectomy  1979  . Colonoscopy    . Total hip arthroplasty Right 11/06/2013    Procedure: RIGHT TOTAL HIP ARTHROPLASTY;  Surgeon: Loanne Drilling, MD;  Location: [facility];  Service: Orthopedics;  Laterality: Right;    History   Social History  . Marital Status: Married     Spouse Name: N/A    Number of Children: 2  . Years of Education: N/A   Occupational History  . Sales    Social History Main Topics  . Smoking status: Never Smoker   . Smokeless tobacco: Never Used  . Alcohol Use: 1.2 - 1.8 oz/week    2-3 Cans of beer per week     Comment: occasional  . Drug Use: No  .     Social History Narrative    married, 2 sons and 2 daughters employed in [employer]. One caffeinated beverage daily.    eview of systems: Positive for: Fatigue All other ROS negative or as per HPI  Physical Exam: BP 134/90  Pulse 92  Ht 6\' 4"  (1.93 m)  Wt 300 lb (136.079 kg)  BMI 36.53 kg/m2 Constitutional: WDWN NAD Eyes: anicteric Mouth: oral and posterior pharynx free of lesions Neck: supple, no mass or thyromegaly Lungs: clear to auscultation bilaterally Cardiovascular: S1S2 with regular rate and rhythm, no rubs murmurs or gallops Abdomen: soft, nontender, nondistended, no masses or organomegaly, normal bowel sounds. Liver span 12 cm in MCL Extremities: no lower extremity edema  Skin: no rash Neuro: alert and oriented x 3 Psych: normal mood and affect  Data Reviewed: Previous pt records and labs.  Casimiro Needle A. [place], PA Student [facility] 07/02/2014 9:33 AM   I have personally seen the patient,  reviewed and repeated key elements of the history and physical and participated in formation of the assessment and plan the student has documented.  Iva Booparl E. Gessner, MD, Clementeen GrahamFACG

## 2014-07-03 LAB — HEPATITIS A ANTIBODY, TOTAL: Hep A Total Ab: NONREACTIVE

## 2014-07-03 LAB — HEPATITIS B SURFACE ANTIGEN: Hepatitis B Surface Ag: NEGATIVE

## 2014-07-03 LAB — HEPATITIS B SURFACE ANTIBODY,QUALITATIVE: Hep B S Ab: NEGATIVE

## 2014-07-03 LAB — HEPATITIS B CORE ANTIBODY, TOTAL: Hep B Core Total Ab: NONREACTIVE

## 2014-07-06 LAB — HEPATITIS C RNA QUANTITATIVE
HCV Quantitative Log: 4.55 {Log} — ABNORMAL HIGH (ref <1.18)
HCV Quantitative: 35835 IU/mL — ABNORMAL HIGH (ref <15)

## 2014-07-07 LAB — HEPATITIS C GENOTYPE: HCV Genotype: 2

## 2014-07-13 ENCOUNTER — Ambulatory Visit (HOSPITAL_COMMUNITY): Payer: BC Managed Care – PPO

## 2014-07-13 ENCOUNTER — Encounter: Payer: Self-pay | Admitting: Internal Medicine

## 2014-07-13 NOTE — Progress Notes (Signed)
Quick Note:  Let him know labs show he is still + for HCV as expected. His Genotype is 2 He needs vaccines for HAV and HBV and pneumovax (unless that was done) Will contact with US results after he has that   ______

## 2014-07-22 ENCOUNTER — Ambulatory Visit (HOSPITAL_COMMUNITY): Payer: BC Managed Care – PPO

## 2014-07-23 ENCOUNTER — Ambulatory Visit (HOSPITAL_COMMUNITY): Payer: BC Managed Care – PPO

## 2014-07-23 ENCOUNTER — Ambulatory Visit (HOSPITAL_COMMUNITY)
Admission: RE | Admit: 2014-07-23 | Discharge: 2014-07-23 | Disposition: A | Discharge status: Home / Self Care | Payer: BC Managed Care – PPO | Source: Ambulatory Visit | Attending: Internal Medicine | Admitting: Internal Medicine

## 2014-07-23 ENCOUNTER — Other Ambulatory Visit (INDEPENDENT_AMBULATORY_CARE_PROVIDER_SITE_OTHER): Payer: BC Managed Care – PPO

## 2014-07-23 DIAGNOSIS — B182 Chronic viral hepatitis C: Secondary | ICD-10-CM | POA: Diagnosis present

## 2014-07-23 LAB — PROTIME-INR
INR: 1 ratio (ref 0.8–1.0)
Prothrombin Time: 10.7 s (ref 9.6–13.1)

## 2014-07-27 NOTE — Progress Notes (Signed)
Quick Note:  Liver scan suggests scarring and cirrhosis Needs  1) schedule an EGD to screen for varices in cirrhosis 2) refer to [facility] for HCV - evaluate and treat ______

## 2014-07-28 ENCOUNTER — Other Ambulatory Visit: Payer: Self-pay

## 2014-07-28 DIAGNOSIS — B182 Chronic viral hepatitis C: Secondary | ICD-10-CM

## 2014-08-09 ENCOUNTER — Other Ambulatory Visit: Payer: BC Managed Care – PPO

## 2014-08-11 ENCOUNTER — Telehealth: Payer: Self-pay | Admitting: Internal Medicine

## 2014-08-11 NOTE — Telephone Encounter (Signed)
All questions answered about upcoming EGD.  He will call back for any additional questions or concerns

## 2014-08-25 ENCOUNTER — Ambulatory Visit (INDEPENDENT_AMBULATORY_CARE_PROVIDER_SITE_OTHER): Payer: BLUE CROSS/BLUE SHIELD | Admitting: Internal Medicine

## 2014-08-25 ENCOUNTER — Encounter: Payer: Self-pay | Admitting: Internal Medicine

## 2014-08-25 VITALS — BP 148/94 | HR 92 | Temp 98.2°F | Ht 76.0 in | Wt 300.0 lb

## 2014-08-25 DIAGNOSIS — Z23 Encounter for immunization: Secondary | ICD-10-CM

## 2014-08-25 MED ORDER — SOFOSBUVIR 400 MG PO TABS: 1.0000 | ORAL_TABLET | Freq: Every day | ORAL | Status: DC

## 2014-08-25 MED ORDER — RIBAVIRIN 200 MG PO CAPS: 600.0000 mg | ORAL_CAPSULE | Freq: Two times a day (BID) | ORAL | Status: DC

## 2014-08-25 NOTE — Patient Instructions (Signed)
Date 08/25/14  Dear Mr. Taussig, As discussed in the ID Clinic, your hepatitis C therapy will include the following medications:     sofosbuvir 400 mg oral daily + ribavirin 1000 mg/1200 mg oral daily                                              OR sofosbuvir 400 mg oral daily + daclatasvir 60 mg oral daily  Please note that ALL MEDICATIONS WILL START ON THE SAME DATE for a total of 24 weeks. ---------------------------------------------------------------- Your HCV Treatment Start Date: TBA   Your HCV genotype: 2    Liver Fibrosis: F4 ---------------------------------------------------------------- YOUR PHARMACY CONTACT:   [facility] [address] Phone: [phone] Hours: Monday to Friday 7:30 am to 6:00 pm   Please always contact your pharmacy at least 3-4 business days before you run out of medications to ensure your next month's medication is ready or 1 week prior to running out if you receive it by mail.  Remember, each prescription is for 28 days. ---------------------------------------------------------------- GENERAL NOTES REGARDING YOUR HEPATITIS C MEDICATION:  RIBAVIRIN - Ribavirin is dosed twice daily approximately every 10-12 hrs with or without food. - Ribavirin can be taken with food to decrease GI upset.  - Ribavirin is contraindicated in pregnancy, and we recommend two effective forms of contraception while on HCV therapy, and for 6 months after completing therapy. - Common Side Effects with Ribavirin:      1. Anemia      2. Nausea      3. Dry Cough      4. Rash      5. Insomnia      6. Photosensitivity  SOFOSBUVIR (SOVALDI): - Sofosbuvir 400 mg tablet is taken daily with OR without food. - The tablets are yellow. - The tablets should be stored at room temperature. - The common side effects with sofosbuvir:      1. Fatigue      2. Headache  - Acid reducing agents such as H2 blockers  (ie. Pepcid (famotidine), Zantac (ranitidine), Tagamet (cimetidine), Axid (nizatidine) and proton pump inhibitors (ie. Prilosec (omeprazole), Protonix (pantoprazole), Nexium (esomeprazole), or Aciphex (rabeprazole)) can decrease effectiveness of Harvoni. Do not take until you have discussed with a health care provider.    -Antacids that contain magnesium and/or aluminum hydroxide (ie. Milk of Magensia, Rolaids, Gaviscon, Maalox, Mylanta, an dArthritis Pain Formula)can reduce absorption of sofosbuvir, so take them at least 4 hours before or after Harvoni.  -Calcium carbonate (calcium supplements or antacids such as Tums, Caltrate, Os-Cal)needs to be taken at least 4 hours hours before or after sofosbuvir.  -St. Pedram's wort or any products that contain St. Sammie's wort like some herbal supplements  Please inform the office prior to starting any of these medications.  - The common side effects with sofosbuvir:      1. Fatigue      2. Headache      3. Nausea      4. Diarrhea      5. Insomnia  DACLATASVIR [facility]) - The common side effects with daclatasvir:      1. Fatigue      2. Headache   Support Path is a suite of resources designed to help patients start with HARVONI and SOVALDI move toward treatment completion GETTING STARTED  Support Path helps patients access therapy and get off to an efficient start  Benefits investigation and prior authorization support Co-pay and other financial assistance A specialty pharmacy finder CO-PAY COUPON The sofosbuvir co-pay coupon may help eligible patients lower their out-of-pocket costs. With a co-pay coupon, most eligible patients may pay no more than $5 per co-pay (restrictions apply) www.harvoni.com call 517-356-92611-(973)883-0459 Not valid for patients enrolled in government healthcare prescription drug programs, such as Medicare Part D and Medicaid. Patients in the coverage gap known as the "donut hole" also are not eligible   Please note that this  only lists the most common side effects and is NOT a comprehensive list of the potential side effects of these medications. For more information, please review the drug information sheets that come with your medication package from the pharmacy.  ---------------------------------------------------------------- GENERAL HELPFUL HINTS ON HCV THERAPY: 1. No alcohol. 2. Protect against sun-sensitivity/sunburns (wear sunglasses, hat, long sleeves, pants and sunscreen). 3. Stay well-hydrated/well-moisturized. 4. Notify the ID Clinic of any changes in your other over-the-counter/herbal or prescription medications. 5. If you miss a dose of your medication, take the missed dose as soon as you remember. Return to your regular time/dose schedule the next day.  6.  Do not stop taking your medications without first talking with your healthcare provider. 7.  You may take Tylenol (acetaminophen), as long as the dose is less than 2000 mg (OR no more than 4 tablets of the Tylenol Extra Strengths 500mg  tablet) in 24 hours. 8.  You will need to obtain routine labs and/or office visits at [facility] at weeks 2, 4, 8,  and 12 as well as 12 and 24 weeks after completion of treatment.   Staci RighterOMER, Roger Fasnacht, MD  [facility] [address] [place], [place]  [id number] [phone]

## 2014-08-25 NOTE — Progress Notes (Signed)
+[name] is a 58 y.o. male who presents for initial evaluation and management of a positive Hepatitis C antibody test.  Patient tested positive 15 years ago. Hepatitis C risk factors present are: none. Patient denies IV drug abuse, sexual contact with person with liver disease, tattoos. Patient has had other studies performed. Results: ultrasound of abdomen, result: F4 elastography, hepatitis C RNA by PCR, result: positive. Patient has had prior treatment for Hepatitis C. Patient does not have a past history of liver disease. Patient does not have a family history of liver disease.   HPI: He was treated in about 2001 with PEG/riba and did not tolerate treatment side effects including significant reported leukopenia, jaundice.  He has not attempted treatment since.  He is scheduled for an EGD. Followed by Dr. Leone Payor.  Has noted a significant amount of fatigue over the years, often feels groggy.   Patient does not have documented immunity to Hepatitis A. Patient does not have documented immunity to Hepatitis B.     Review of Systems A comprehensive review of systems was negative.   Past Medical History  Diagnosis Date  . Arthritis   . Hepatitis C   . Personal history of adenomatous colonic polyps 08/05/2012    8 mm tubulovillous adenoma 2010 08/05/2012 no polyps - next routine exam 2018    . Pneumonia age 58    hx of    Prior to Admission medications   Medication Sig Start Date End Date Taking? Authorizing Provider  lisdexamfetamine (VYVANSE) 20 MG capsule Take 20 mg by mouth daily.   Yes Historical Provider, MD    No Known Allergies  History  Substance Use Topics  . Smoking status: Never Smoker   . Smokeless tobacco: Never Used  . Alcohol Use: 1.2 - 1.8 oz/week    2-3 Cans of beer per week     Comment: occasional    Family History  Problem Relation Age of Onset  . Colon cancer Neg Hx   . Esophageal cancer Neg Hx   . Stomach cancer Neg Hx   . Rectal cancer Neg Hx        Objective:   Filed Vitals:   08/25/14 1406  BP: 148/94  Pulse: 92  Temp: 98.2 F (36.8 C)   in no apparent distress and alert HEENT: anicteric Cor RRR and No murmurs clear Bowel sounds are normal, liver is not enlarged, spleen is not enlarged peripheral pulses normal, no pedal edema, no clubbing or cyanosis negative for - jaundice, spider hemangioma, telangiectasia, palmar erythema, ecchymosis and atrophy  Laboratory Genotype:  Lab Results  Component Value Date   HCVGENOTYPE 2 07/02/2014   HCV viral load:  Lab Results  Component Value Date   WBC 6.6 07/02/2014   HGB 16.4 07/02/2014   HCT 47.2 07/02/2014   MCV 96.4 07/02/2014   PLT 191.0 07/02/2014    Lab Results  Component Value Date   CREATININE 1.1 07/02/2014   BUN 15 07/02/2014   NA 136 07/02/2014   K 4.1 07/02/2014   CL 101 07/02/2014   CO2 28 07/02/2014    Lab Results  Component Value Date   ALT 65* 07/02/2014   AST 22 07/02/2014   ALKPHOS 83 07/02/2014   BILITOT 1.6* 07/02/2014     Assessment: Hepatitis C genotype 2  Plan: 1) Patient counseled extensively on limiting acetaminophen to no more than 2 grams daily, avoidance of alcohol. 2) Transmission discussed with patient including sexual transmission, sharing razors and toothbrush.  3) EGD scheduled 4) Will need referral for substance abuse counseling: No. 5) Will prescribe sofosbuvir and weight based ribavirin for 24 weeks.  Counseled the patient on side effects and monitoring.  He should get 24 weeks based on much improved success in patients previously treated with cirrhosis.   6) Follow up 2 weeks after starting medication

## 2014-08-27 ENCOUNTER — Ambulatory Visit (AMBULATORY_SURGERY_CENTER): Payer: Self-pay | Admitting: *Deleted

## 2014-08-27 VITALS — Ht 76.0 in | Wt 300.0 lb

## 2014-08-27 DIAGNOSIS — I8500 Esophageal varices without bleeding: Secondary | ICD-10-CM

## 2014-08-27 NOTE — Progress Notes (Signed)
No egg or soy allergy. ewm No diet pills. ewm  no home 02 use. Ewm, No problems with past sedation. emw

## 2014-09-06 ENCOUNTER — Telehealth: Payer: Self-pay | Admitting: Internal Medicine

## 2014-09-06 ENCOUNTER — Telehealth: Payer: Self-pay | Admitting: *Deleted

## 2014-09-06 NOTE — Telephone Encounter (Signed)
Patient is for EGD tomorrow. Spoke with patient, states "started Friday with severe bone,joint pain in feet, then spread to hands within 15-20 minutes. Took Ibuprofen felt better. Then this pain came back worse Saturday. Took Ibuprofen and Meloxicam.  It subsided but left numbness in finger tips. This also happened in January. Pain came back this morning took Ibuprofen and Meloxicam. " patient request something for this. He is concern what he is taking is not good for him.  Please advise, thank you.

## 2014-09-06 NOTE — Telephone Encounter (Signed)
He should use meloxicam once a day or ibuprofen intermittently but not dose and I will discuss with him more tomorrow

## 2014-09-06 NOTE — Telephone Encounter (Signed)
Spoke with patient and he will take his meds as ordered.  Still has a lot of questions for Dr. Leone PayorGessner tomorrow.

## 2014-09-06 NOTE — Telephone Encounter (Signed)
Pt concerned about "new" joint/calf symptoms.  Pt thinks that these are related to his Hep C.  Bilateral ankles/hands feel "like" arthritis.  His fingertips are numb which has not subsided.  He has NOT started any of the Hep C medications due to not being approved yet.  Most recently started new ADD medication prescribed by his psychiatrist.  RN advised pt to call his psychiatrist about these "new" symptoms.  Pt stated that he wanted an appointment with an ID MD.  Appt scheduled for 09/07/14 w/ Dr. Ninetta LightsHatcher.   Dr. Luciana Axeomer not available.

## 2014-09-07 ENCOUNTER — Encounter: Payer: Self-pay | Admitting: Internal Medicine

## 2014-09-07 ENCOUNTER — Ambulatory Visit (INDEPENDENT_AMBULATORY_CARE_PROVIDER_SITE_OTHER): Payer: BLUE CROSS/BLUE SHIELD | Admitting: Infectious Diseases

## 2014-09-07 ENCOUNTER — Ambulatory Visit (AMBULATORY_SURGERY_CENTER): Payer: BC Managed Care – PPO | Admitting: Internal Medicine

## 2014-09-07 ENCOUNTER — Encounter: Payer: Self-pay | Admitting: Infectious Diseases

## 2014-09-07 VITALS — BP 156/106 | HR 102 | Temp 98.5°F | Wt 300.0 lb

## 2014-09-07 VITALS — BP 146/94 | HR 87 | Temp 98.0°F | Resp 31 | Ht 76.0 in | Wt 300.0 lb

## 2014-09-07 DIAGNOSIS — M2550 Pain in unspecified joint: Secondary | ICD-10-CM | POA: Insufficient documentation

## 2014-09-07 DIAGNOSIS — K7460 Unspecified cirrhosis of liver: Principal | ICD-10-CM

## 2014-09-07 DIAGNOSIS — B182 Chronic viral hepatitis C: Secondary | ICD-10-CM

## 2014-09-07 DIAGNOSIS — Z13818 Encounter for screening for other digestive system disorders: Secondary | ICD-10-CM

## 2014-09-07 LAB — CBC
HCT: 45.5 % (ref 39.0–52.0)
Hemoglobin: 16.3 g/dL (ref 13.0–17.0)
MCH: 32.5 pg (ref 26.0–34.0)
MCHC: 35.8 g/dL (ref 30.0–36.0)
MCV: 90.8 fL (ref 78.0–100.0)
Platelets: 223 10*3/uL (ref 150–400)
RBC: 5.01 MIL/uL (ref 4.22–5.81)
RDW: 13.5 % (ref 11.5–15.5)
WBC: 8.6 10*3/uL (ref 4.0–10.5)

## 2014-09-07 MED ORDER — SODIUM CHLORIDE 0.9 % IV SOLN: 500.0000 mL | INTRAVENOUS | Status: DC

## 2014-09-07 NOTE — Progress Notes (Signed)
A/ox3 pleased with MAC, report to Suzanne RN 

## 2014-09-07 NOTE — Assessment & Plan Note (Addendum)
Will check his cryoglobulins, check his ESR, CRP, ANA, check his uric acid. Will have him seen him rheum.  Will take low doses, sparingly, of NSAID. Will have him seen by Rheum.  He needs pcp Again, he will f/u with Dr Linus Salmons

## 2014-09-07 NOTE — Progress Notes (Signed)
   Subjective:    Patient ID: [name], male    DOB: 30-Apr-1956, 58 y.o.   MRN: [id number]  HPI 58 yo M with hx of Hep C genotype 2, F4 on elastography. He was previously treated with Peg-ribavirin in 2001 but was unable to tolerate therapy.  Also hx of carpal tunnel syndrome, cubital tunnel syndrome. Bilateral hip replacement surgery. Beginning 10-8 he developed pain in his RUQ/liver as well as pain in his ankles. Throbbing pain. Then noted pain radiating into his hands.  Pain slightly better with ibuprofen. On 10-10 has noted numbness in his fingers, coordination in his hands, shakiness in his hands. Believes his gait is also abnormal.  Was seen by hand surgery and given prednisone.  Now liver is less tender.   States 2 months ago started on vivance. Has since quit taking due to reading about side effects (quit on 10-8).  Worried that his liver pain and myalgias are related to this.    Review of Systems     Objective:   Physical Exam  Constitutional: He is oriented to person, place, and time. He appears well-developed and well-nourished.  HENT:  Mouth/Throat: No oropharyngeal exudate.  Eyes: EOM are normal. Pupils are equal, round, and reactive to light. No scleral icterus.  Neck: Neck supple.  Cardiovascular: Normal rate, regular rhythm and normal heart sounds.   Pulmonary/Chest: Effort normal and breath sounds normal.  Abdominal: Soft. Bowel sounds are normal. He exhibits no distension and no mass. There is no tenderness. There is no guarding.  Musculoskeletal: He exhibits no edema and no tenderness.  Lymphadenopathy:    He has no cervical adenopathy.  Neurological: He is alert and oriented to person, place, and time. He exhibits normal muscle tone. Coordination normal.          Assessment & Plan:

## 2014-09-07 NOTE — Assessment & Plan Note (Addendum)
He is awaiting EGD today.  Will recheck his CMP, u/s of liver.  There is some delay in getting his hep c tx, will defer this til he has f/u with Dr Luciana Axeomer.

## 2014-09-07 NOTE — Addendum Note (Signed)
Addended by: Reighan Hipolito C on: 09/07/2014 09:39 AM   Modules accepted: Orders

## 2014-09-07 NOTE — Patient Instructions (Addendum)
This exam was normal - no complications from the liver disease.  You should see me again in about 6 months.  I appreciate the opportunity to care for you. Iva Booparl E. Irving Bloor, MD, FACG  YOU HAD AN ENDOSCOPIC PROCEDURE TODAY AT THE [facility]: Refer to the procedure report that was given to you for any specific questions about what was found during the examination.  If the procedure report does not answer your questions, please call your gastroenterologist to clarify.  If you requested that your care partner not be given the details of your procedure findings, then the procedure report has been included in a sealed envelope for you to review at your convenience later.  YOU SHOULD EXPECT: Some feelings of bloating in the abdomen. Passage of more gas than usual.  Walking can help get rid of the air that was put into your GI tract during the procedure and reduce the bloating. If you had a lower endoscopy (such as a colonoscopy or flexible sigmoidoscopy) you may notice spotting of blood in your stool or on the toilet paper. If you underwent a bowel prep for your procedure, then you may not have a normal bowel movement for a few days.  DIET: Your first meal following the procedure should be a light meal and then it is ok to progress to your normal diet.  A half-sandwich or bowl of soup is an example of a good first meal.  Heavy or fried foods are harder to digest and may make you feel nauseous or bloated.  Likewise meals heavy in dairy and vegetables can cause extra gas to form and this can also increase the bloating.  Drink plenty of fluids but you should avoid alcoholic beverages for 24 hours.  ACTIVITY: Your care partner should take you home directly after the procedure.  You should plan to take it easy, moving slowly for the rest of the day.  You can resume normal activity the day after the procedure however you should NOT DRIVE or use heavy machinery for 24 hours (because of the  sedation medicines used during the test).    SYMPTOMS TO REPORT IMMEDIATELY: A gastroenterologist can be reached at any hour.  During normal business hours, 8:30 AM to 5:00 PM Monday through Friday, call [phone].  After hours and on weekends, please call the GI answering service at [phone] who will take a message and have the physician on call contact you.   Following upper endoscopy (EGD)  Vomiting of blood or coffee ground material  New chest pain or pain under the shoulder blades  Painful or persistently difficult swallowing  New shortness of breath  Fever of 100F or higher  Black, tarry-looking stools  FOLLOW UP: If any biopsies were taken you will be contacted by phone or by letter within the next 1-3 weeks.  Call your gastroenterologist if you have not heard about the biopsies in 3 weeks.  Our staff will call the home number listed on your records the next business day following your procedure to check on you and address any questions or concerns that you may have at that time regarding the information given to you following your procedure. This is a courtesy call and so if there is no answer at the home number and we have not heard from you through the emergency physician on call, we will assume that you have returned to your regular daily activities without incident.  SIGNATURES/CONFIDENTIALITY: You and/or your care partner  have signed paperwork which will be entered into your electronic medical record.  These signatures attest to the fact that that the information above on your After Visit Summary has been reviewed and is understood.  Full responsibility of the confidentiality of this discharge information lies with you and/or your care-partner.

## 2014-09-07 NOTE — Op Note (Signed)
[facility] [address] [place], [id number]   ENDOSCOPY PROCEDURE REPORT  PATIENT: [name], [name]  MR#: [id number] BIRTHDATE: 01-23-1956 , 58  yrs. old GENDER: male ENDOSCOPIST: Iva Booparl E Gessner, MD, [facility] PROCEDURE DATE:  09/07/2014 PROCEDURE:  EGD, screening ASA CLASS:     Class III INDICATIONS:  screening for varices. MEDICATIONS: Propofol 150 mg IV and Monitored anesthesia care TOPICAL ANESTHETIC: none  DESCRIPTION OF PROCEDURE: After the risks benefits and alternatives of the procedure were thoroughly explained, informed consent was obtained.  The LB [id number] endoscope was introduced through the mouth and advanced to the second portion of the duodenum , Without limitations.  The instrument was slowly withdrawn as the mucosa was fully examined.      EXAM: The esophagus and gastroesophageal junction were completely normal in appearance.  The stomach was entered and closely examined.The antrum, angularis, and lesser curvature were well visualized, including a retroflexed view of the cardia and fundus. The stomach wall was normally distensable.  The scope passed easily through the pylorus into the duodenum.  Retroflexed views revealed no abnormalities.     The scope was then withdrawn from the patient and the procedure completed.  COMPLICATIONS: There were no immediate complications.  ENDOSCOPIC IMPRESSION: Normal EGD  RECOMMENDATIONS: Clinic visit with me in 6 months  - will decide timing of future endoscopy, if needed as time goes on.    eSigned:  Iva Booparl E Gessner, MD, [facility] 09/07/2014 12:10 PM    CC:The Patient

## 2014-09-08 ENCOUNTER — Telehealth: Payer: Self-pay | Admitting: *Deleted

## 2014-09-08 LAB — COMPREHENSIVE METABOLIC PANEL
ALT: 54 U/L — ABNORMAL HIGH (ref 0–53)
AST: 22 U/L (ref 0–37)
Albumin: 4.3 g/dL (ref 3.5–5.2)
Alkaline Phosphatase: 54 U/L (ref 39–117)
BUN: 15 mg/dL (ref 6–23)
CO2: 24 mEq/L (ref 19–32)
Calcium: 9.6 mg/dL (ref 8.4–10.5)
Chloride: 104 mEq/L (ref 96–112)
Creat: 0.87 mg/dL (ref 0.50–1.35)
Glucose, Bld: 94 mg/dL (ref 70–99)
Potassium: 3.8 mEq/L (ref 3.5–5.3)
Sodium: 139 mEq/L (ref 135–145)
Total Bilirubin: 1.8 mg/dL — ABNORMAL HIGH (ref 0.2–1.2)
Total Protein: 7.2 g/dL (ref 6.0–8.3)

## 2014-09-08 LAB — ANA: Anti Nuclear Antibody(ANA): NEGATIVE

## 2014-09-08 LAB — SEDIMENTATION RATE: Sed Rate: 1 mm/hr (ref 0–16)

## 2014-09-08 LAB — C-REACTIVE PROTEIN: CRP: <0.5 mg/dL (ref <0.60)

## 2014-09-08 LAB — URIC ACID: Uric Acid, Serum: 6.2 mg/dL (ref 4.0–7.8)

## 2014-09-08 NOTE — Telephone Encounter (Signed)
  Follow up Call-  Call back number 09/07/2014 08/05/2012  Post procedure Call Back phone  # [phone]  Permission to leave phone message Yes Yes     Patient questions:  Do you have a fever, pain , or abdominal swelling? No. Pain Score  0 *  Have you tolerated food without any problems? Yes.    Have you been able to return to your normal activities? Yes.    Do you have any questions about your discharge instructions: Diet   No. Medications  No. Follow up visit  No.  Do you have questions or concerns about your Care? No.  Actions: * If pain score is 4 or above: No action needed, pain <4.

## 2014-09-08 NOTE — Telephone Encounter (Signed)
Office notes and labs faxed to Dr. Stacey DrainWilliam Truslow, rheumatology for upcoming appt. Fax [phone]

## 2014-09-09 ENCOUNTER — Telehealth: Payer: Self-pay | Admitting: *Deleted

## 2014-09-09 NOTE — Telephone Encounter (Signed)
Patient called c/o ongoing numbness in both hands and joint pain and stiffness with difficulty walking. His appointment with rheumatology is for 09/15/14. He still has not scheduled an appt with a PCP and I gave him 2 phone numbers for [facility] for him to get established. He was asking if maybe he should be referred to a neurologist and I told him that was something for a PCP to decide. He is also wondering if he could have fibromyalgia. He did not go to work today and was wondering who could be responsible for paperwork if he needs to be out long term. I stressed that he needs to start with a PCP. Phone call placed to pharmacist, Marcelino DusterMichelle and she is going to check with outpatient pharmacy regarding his Hep C medication and what is going on with that.

## 2014-09-09 NOTE — Telephone Encounter (Signed)
Patient called to speak with Annice PihJackie and would not tell me what it was about as she knows what it is about and he did not want to repeat himself. Advised him will have her call when she can as she is working with patients.

## 2014-09-13 LAB — CRYOGLOBULIN: Cryoglobulin: DETECTED — AB

## 2014-09-14 ENCOUNTER — Encounter: Payer: Self-pay | Admitting: Family

## 2014-09-14 ENCOUNTER — Other Ambulatory Visit: Payer: BC Managed Care – PPO

## 2014-09-14 ENCOUNTER — Ambulatory Visit (INDEPENDENT_AMBULATORY_CARE_PROVIDER_SITE_OTHER): Payer: BC Managed Care – PPO | Admitting: Family

## 2014-09-14 VITALS — BP 140/102 | HR 83 | Temp 97.7°F | Resp 16 | Ht 76.0 in | Wt 300.0 lb

## 2014-09-14 DIAGNOSIS — M2550 Pain in unspecified joint: Secondary | ICD-10-CM

## 2014-09-14 MED ORDER — DOXYCYCLINE HYCLATE 100 MG PO TABS: 100.0000 mg | ORAL_TABLET | Freq: Two times a day (BID) | ORAL | Status: DC

## 2014-09-14 NOTE — Patient Instructions (Addendum)
Thank you for choosing [facility].  Summary/Instructions:   Please stop by the lab prior to leaving for blood work.   Keep your appointments for tomorrow and Friday.  Your prescription has been sent to your pharmacy. Please take the medication until the dose is complete or until instructed to stop  Continue to take the pain medication as prescribed.  If you symptoms worsen or fail to improve beyond current management - go to the emergency room.

## 2014-09-14 NOTE — Progress Notes (Signed)
   Subjective:    Patient ID: [name], male    DOB: 05/04/1956, 58 y.o.   MRN: [id number]   Chief Complaint  Patient presents with  . Establish Care    muscle, joint, body pain x10 days, unexplained severe pain, excessive sweating   HPI:  [name] is a 58 y.o. male who presents today to establish care and muscle, joint and body pain.   Started 10 days ago with excruiating pain in joints and toes which spread to his hands and wrist. Pain greater in lower extremity compared to upper extremity. Pain subsided and did not return that day. The following day hands, feet pain returned. Hands this time felt like they were in a vice. Has tried gel pack ice and anti-inflammatories which helped to ease the pain. Woke up next day the pain had spread to calves and felt like muscles being ripped and tearing. Bone pain and joint pain, led to him to feel like he could barley walk. Numbness in hands and feet and achyness and burning in hands and calves. Currently experiencing times of uncontrolled sweating and fatigue. Indicates most of the pain is with activity. Denies any international travel. May have had a potential tick bite on his head, but is unsure.   Recently seen by Infectious disease with diagnosis of arthralgia. Labs ordered were negative with the exception of cryoglobulins which was positive.   Has an appointment with rhumatology tomorrow and neurology on Friday.   No Known Allergies  Current Outpatient Prescriptions on File Prior to Visit  Medication Sig Dispense Refill  . lisdexamfetamine (VYVANSE) 20 MG capsule Take 20 mg by mouth daily.      . ribavirin (REBETOL) 200 MG capsule Take 3 capsules (600 mg total) by mouth 2 (two) times daily.  168 capsule  5  . Sofosbuvir 400 MG TABS Take 1 tablet by mouth daily.  28 tablet  5   No current facility-administered medications on file prior to visit.   Past Medical History  Diagnosis Date  . Arthritis   . Hepatitis C   . Personal  history of adenomatous colonic polyps 08/05/2012    8 mm tubulovillous adenoma 2010 08/05/2012 no polyps - next routine exam 2018    . Pneumonia age [age]    hx of   Review of Systems    See HPI   Objective:    BP 140/102  Pulse 83  Temp(Src) 97.7 F (36.5 C) (Oral)  Resp 16  Ht 6\' 4"  (1.93 m)  Wt 300 lb (136.079 kg)  BMI 36.53 kg/m2  SpO2 94% Nursing note and vital signs reviewed.  Physical Exam  Constitutional: He is oriented to person, place, and time. He appears well-developed and well-nourished. He appears distressed (Mildly distressed and sweaty).  Cardiovascular: Normal rate, regular rhythm and normal heart sounds.   Pulmonary/Chest: Effort normal and breath sounds normal.  Musculoskeletal:  No obvious deformity, discoloration or edema noted. Indicates minimal tenderness of calves and upper extremities to palpation, increased pain noted with both PROM and AROM.   Neurological: He is alert and oriented to person, place, and time.  Skin: Skin is warm.  Psychiatric: He has a normal mood and affect. His behavior is normal. Judgment and thought content normal.       Assessment & Plan:

## 2014-09-14 NOTE — Assessment & Plan Note (Signed)
Joint pain of unknown origin. Potential for Lyme Disease. ANA was negative but cannot rule out RA. Obtain B. Bugdorfi antibiodies and start doxycyline prophylacticaly until lyme disease results come back. Will be see by Rhematology and Neurology this week. Follow up with primary care dependent upon specialist recommendations.

## 2014-09-14 NOTE — Progress Notes (Signed)
Pre visit review using our clinic review tool, if applicable. No additional management support is needed unless otherwise documented below in the visit note. 

## 2014-09-15 ENCOUNTER — Ambulatory Visit (HOSPITAL_COMMUNITY): Payer: BC Managed Care – PPO

## 2014-09-15 LAB — B. BURGDORFI ANTIBODIES: B burgdorferi Ab IgG+IgM: 0.2 {ISR}

## 2014-09-16 ENCOUNTER — Encounter: Payer: Self-pay | Admitting: *Deleted

## 2014-09-16 ENCOUNTER — Telehealth: Payer: Self-pay | Admitting: *Deleted

## 2014-09-16 ENCOUNTER — Telehealth: Payer: Self-pay | Admitting: Family

## 2014-09-16 NOTE — Telephone Encounter (Signed)
Dr. Kellie Simmeringruslow did speak with Dr. Luciana Axeomer regarding patient. Still waiting on Hep C medication approval. Has been approved for 12 weeks and Minh, pharmacist has sent a prior authorization requesting 24 week approval.

## 2014-09-16 NOTE — Telephone Encounter (Signed)
Received call for Dr. Ninetta LightsHatcher from Dr. Kellie Simmeringruslow regarding patient's referral to rheumatology - he was seen yesterday, Dr. Kellie Simmeringruslow would like to discuss results.  Dr. Ninetta LightsHatcher is unavailable until Monday and patient is to follow up with Dr. Luciana Axeomer.  Dr. Ines Bloomerruslow's office given Dr. Ephriam Knucklesomer's pager number, although his nurse stated that he may wait until Monday to speak with Dr. Ninetta LightsHatcher. Andree CossHowell, Michelle M, RN

## 2014-09-16 NOTE — Telephone Encounter (Signed)
Please call the patient and inform him the Lyme Disease test is negative, thus unless the rhumatologist recommended, there is no need to continue the doxycycline.

## 2014-09-17 ENCOUNTER — Ambulatory Visit: Payer: BC Managed Care – PPO | Admitting: Family

## 2014-09-17 ENCOUNTER — Ambulatory Visit: Payer: BC Managed Care – PPO | Admitting: Neurology

## 2014-09-17 NOTE — Telephone Encounter (Signed)
Called pt to let him know his lyme disease test was negative. Said he went to the rheumotologist yesterday and a lot of his labs were "off the charts" wanted us to be aware. Going to the neurologist today. Says he's feeling a little better. He's not as weak as he was when he came in.

## 2014-09-23 ENCOUNTER — Ambulatory Visit (INDEPENDENT_AMBULATORY_CARE_PROVIDER_SITE_OTHER): Payer: BC Managed Care – PPO | Admitting: Neurology

## 2014-09-23 ENCOUNTER — Telehealth: Payer: Self-pay | Admitting: Licensed Clinical Social Worker

## 2014-09-23 ENCOUNTER — Encounter: Payer: Self-pay | Admitting: Neurology

## 2014-09-23 ENCOUNTER — Telehealth: Payer: Self-pay | Admitting: Family

## 2014-09-23 ENCOUNTER — Ambulatory Visit: Payer: BC Managed Care – PPO | Admitting: Neurology

## 2014-09-23 VITALS — BP 122/90 | HR 90 | Temp 98.0°F | Resp 20 | Ht 76.0 in | Wt 293.2 lb

## 2014-09-23 DIAGNOSIS — M791 Myalgia, unspecified site: Secondary | ICD-10-CM

## 2014-09-23 DIAGNOSIS — R200 Anesthesia of skin: Secondary | ICD-10-CM

## 2014-09-23 DIAGNOSIS — M2550 Pain in unspecified joint: Secondary | ICD-10-CM

## 2014-09-23 DIAGNOSIS — R531 Weakness: Secondary | ICD-10-CM

## 2014-09-23 DIAGNOSIS — R202 Paresthesia of skin: Secondary | ICD-10-CM

## 2014-09-23 DIAGNOSIS — B182 Chronic viral hepatitis C: Secondary | ICD-10-CM

## 2014-09-23 LAB — HIV ANTIBODY (ROUTINE TESTING W REFLEX): HIV 1&2 Ab, 4th Generation: NONREACTIVE

## 2014-09-23 NOTE — Telephone Encounter (Signed)
Patient would like Dr. Luciana Axeomer to call him concerning [facility]. He has questions about the results. I could not get any other information.

## 2014-09-23 NOTE — Telephone Encounter (Signed)
I know he had them done somewhere else, he needed your opinion on them.

## 2014-09-23 NOTE — Telephone Encounter (Signed)
Spoke with patient regarding his current status and that specialists are continuing to rule out causes for pain and sweating. Discussed patient's work and potential need for [employer]ML[employer] or short term disability. Also discussed [place]. He is going to send most recent labs to the office. Will continue to follow.

## 2014-09-23 NOTE — Progress Notes (Addendum)
NEUROLOGY CONSULTATION NOTE  [name] MRN: [id number] DOB: 07/21/1956  Referring provider: Kelby Fam, MD Primary care provider: Jeanine Luz, FNP  Reason for consult:  Sudden onset pain, weakness and paresthesias  HISTORY OF PRESENT ILLNESS: [name] is a 58 year old right-handed man with history of chronic hepatitis C, carpal tunnel syndrome, cubital tunnel syndrome, bilateral hip replacements, and arthritis who presents for sudden onset of arthralgias, myalgias, numbness and weakness.    On 09/03/14, he developed sudden onset of severe pain involving his ankles and toes which quickly spread to include his hands and wrists.  He also had associated right upper quadrant pain.  He also began experiencing sweating throughout the day.  There was no associated redness, heat or swelling.  He developed increase shearing pain in the calves.  The pain got so severe, that he had trouble walking.  He applied ice and heat, which helped, as well as ibuprofen and flexeril.  He was started on prednisone and oxycodone, which has helped the pain.  He continues on prednisone 60mg .  He has trouble sleeping.  He had been experiencing fatigue and some mild dyspnea.  He also reports numbness in the hands and feet.  He says that he has sensation of tightness and weakness in his hands and legs.  There was a question that he may have had a tick bite, so he was started on doxycycline.  Labs from 09/07/14 showed ANA negative, CRP negative, Sed rate 1, and uric acid 6.2.  WBC was 8.6, HGB 16.3, HCT 45.5, PLT 223, Na 139, K 3.8, BUN 15, Cr 0.87, TB 1.8, TP 7.2, ALP 54, AST 22, and ALT 54.  Cryoglobulin was positive.  Lyme titer was negative, so the doxycycline was discontinued.  He was evaluated by Dr. Kellie Simmering, a rheumatologist.  .  Labs from 09/15/14 showed that CBC was 9.7, HGB 16, HCT 40.9, PLT 247.  LFTs showed ALP 50, AST 107, ALT 350, total bil 1.7.  LDH was 288.  CK was 1116.  He had repeat lab tests performed  on 09/21/14, which showed ALP 53, AST 64, ALT 415, LDH 234, and CK 163.  HCV RNA quant was 1,596, 652 (it was 35, 835 in August).  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  He is concerned regarding his ability to perform his job.  He denies any preceding illness but had Hep A, B and pneumonia vaccine on 08/25/14.  He has not had any fevers and has not travelled outside the [place].  Arthralgias and myalgias have resolved, but he still has sensation of tightness in the muscles, numbness and weakness in the extremities.  He does have a history of carpal tunnel syndrome, cubital tunnel syndrome and bilateral hip replacement surgery.  He has a history of chronic Hepatitis C of indeterminate cause, diagnosed 15 years ago.  He had been on interferon and ribavirin for many years, but was discontinued due to side effects.  He established care with infectious disease at the end of September and was supposed to start sofosbuvir and ribavirin, which has been on-hold due to waiting for approval.   During the summer, he was started on Vyvance, but stopped when the symptoms began.  Hepatitis panel from 07/02/14 showed Hep A ab non-reactive, Hep B core ab non-reactive,  Hep B surface ab negative, Hep B surface ag negative, Hep C genotype 2, HCV quantative 35835, HCV quantitative log 4.55.  He had an abdominal ultrasound on 07/23/14,  which showed a median hepatic shear wave velocity of 2.69 m/sec and Metavir fibrosis score of F 4.  PAST MEDICAL HISTORY: Past Medical History  Diagnosis Date  . Arthritis   . Hepatitis C   . Personal history of adenomatous colonic polyps 08/05/2012    8 mm tubulovillous adenoma 2010 08/05/2012 no polyps - next routine exam 2018    . Pneumonia age 58    hx of  . Hiatal hernia     PAST SURGICAL HISTORY: Past Surgical History  Procedure Laterality Date  . Total hip arthroplasty  2006    left  . Umbilical hernia repair  2010    right  . Tonsillectomy  1979    . Colonoscopy    . Total hip arthroplasty Right 11/06/2013    Procedure: RIGHT TOTAL HIP ARTHROPLASTY;  Surgeon: Loanne DrillingFrank V Aluisio, MD;  Location: [facility];  Service: Orthopedics;  Laterality: Right;    MEDICATIONS: Current Outpatient Prescriptions on File Prior to Visit  Medication Sig Dispense Refill  . cyclobenzaprine (FLEXERIL) 10 MG tablet       . doxycycline (VIBRA-TABS) 100 MG tablet Take 1 tablet (100 mg total) by mouth 2 (two) times daily.  20 tablet  0  . lisdexamfetamine (VYVANSE) 20 MG capsule Take 20 mg by mouth daily.      . Oxycodone HCl 10 MG TABS       . ribavirin (REBETOL) 200 MG capsule Take 3 capsules (600 mg total) by mouth 2 (two) times daily.  168 capsule  5  . Sofosbuvir 400 MG TABS Take 1 tablet by mouth daily.  28 tablet  5   No current facility-administered medications on file prior to visit.    ALLERGIES: No Known Allergies  FAMILY HISTORY: Family History  Problem Relation Age of Onset  . Colon cancer Neg Hx   . Esophageal cancer Neg Hx   . Stomach cancer Neg Hx   . Rectal cancer Neg Hx   . Clotting disorder Father   . Stroke Father     SOCIAL HISTORY: History   Social History  . Marital Status: Married    Spouse Name: N/A    Number of Children: 2  . Years of Education: N/A   Occupational History  . Sales    Social History Main Topics  . Smoking status: Never Smoker   . Smokeless tobacco: Never Used  . Alcohol Use: No     Comment: occasional  . Drug Use: No  . Sexual Activity: Yes   Other Topics Concern  . Not on file   Social History Narrative   married, 2 sons and 2 daughters employed in Airline pilotsales. One caffeinated beverage daily    REVIEW OF SYSTEMS: Constitutional: Fatigue, diaphoresis.  No fevers, chills, change in appetite Eyes: No visual changes, double vision, eye pain Ear, nose and throat: No hearing loss, ear pain, nasal congestion, sore throat Cardiovascular: No chest pain, palpitations Respiratory:  Very mild  dyspnea GastrointestinaI: hepatitis C Genitourinary:  No dysuria, urinary retention or frequency Musculoskeletal:  No neck pain, back pain Integumentary: Face flushed.  Mottled appearance of hands and feet. Neurological: as above Psychiatric: Insomnia, anxiety Endocrine: diaphoresis, fatigue. Hematologic/Lymphatic:  No anemia, purpura, petechiae. Allergic/Immunologic: no itchy/runny eyes, nasal congestion, recent allergic reactions, rashes  PHYSICAL EXAM: Filed Vitals:   09/23/14 0945  BP: 122/90  Pulse: 90  Temp: 98 F (36.7 C)  Resp: 20   General: No acute distress Head:  Normocephalic/atraumatic Neck: supple, no paraspinal tenderness, full  range of motion Back: No paraspinal tenderness Heart: regular rate and rhythm Lungs: Clear to auscultation bilaterally. Vascular: No carotid bruits. Extremities:  Mottled appearance of hands and feet (possible Raynaud's phenomenon) Neurological Exam: Mental status: alert and oriented to person, place, and time, recent and remote memory intact, fund of knowledge intact, attention and concentration intact, speech fluent and not dysarthric, language intact. Cranial nerves: CN I: not tested CN II: pupils equal, round and reactive to light, visual fields intact, fundi unremarkable, without vessel changes, exudates, hemorrhages or papilledema. CN III, IV, VI:  full range of motion, no nystagmus, no ptosis CN V: facial sensation intact CN VII: upper and lower face symmetric CN VIII: reduced hearing on the left CN IX, X: gag intact, uvula midline CN XI: sternocleidomastoid and trapezius muscles intact CN XII: tongue midline Bulk & Tone: normal, no fasciculations. Motor: 5-/5 interossei, left APB, knee flexion.  4/5 wrist flexion.  3/5 right toe extension.  Otherwise, 5/5. Sensation: endorses reduced vibration in the toes and reduced pinprick sensation in the hands and feet in stocking-glove distribution. Deep Tendon Reflexes: trace in the  upper extremities, 2+ patellars and 1+ ankles.  Toes downgoin. Finger to nose testing: no dysmetria Heel to shin: no dysmetria Gait: stiffened, short stride, mildly wide-based.  Able to turn.  Difficulty walking on toes, heels and in tandem. Romberg negative.  IMPRESSION: Acute onset of arthralgias, myalgias, paresthesias, diaphoresis.  Etiology unclear.  An acute neuropathy and myopathy is considered.  Possible etiologies include an autoimmune process, post-viral illness (following vaccination), reactivated Hep C and/or cryoglobinemia (given the positive cryoglobulins and the increase in the HCV RNA quant, suspect reactivation of Hepatitis C). Chronic hepatitis C.  PLAN: 1.  We must get NCV-EMG to evaluate for neuropathy and myopathy.  Further testing pending results. 2.  Will check HIV 3.  Follow up after NCV-EMG 4.  Follow up with ID and rheumatology  60 minutes spent with patient, over 50% spent discussing etiologies and coordinating care.  Thank you for allowing me to take part in the care of this patient.  Shon MilletAdam Manjit Bufano, DO  CC:  Jeanine LuzGregory Calone, FNP  Stacey DrainWilliam Truslow, MD  Staci Righterobert Comer, MD

## 2014-09-23 NOTE — Patient Instructions (Signed)
At this point, I feel the symptoms are most likely related to the Hepatitis C.  We will check HIV status We will get EMG to evaluate nerves and muscles to determine if any other tests are necessary Follow up after EMG

## 2014-09-23 NOTE — Telephone Encounter (Signed)
Pt requesting call from Marcos EkeGreg Calone if possible today regarding current condition, pt prefers to talk to [place].

## 2014-09-23 NOTE — Telephone Encounter (Signed)
Left message per Dr. Luciana Axeomer that he has been in touch with both Dr. Kellie Simmeringruslow and Dr. Everlena CooperJaffe, is in agreement with them, will defer to Dr. Kellie Simmeringruslow. Andree CossHowell, Michelle M, RN

## 2014-09-23 NOTE — Telephone Encounter (Signed)
I have not done any recent labs on him.  thanks

## 2014-09-24 ENCOUNTER — Ambulatory Visit: Payer: BC Managed Care – PPO

## 2014-09-24 ENCOUNTER — Telehealth: Payer: Self-pay | Admitting: *Deleted

## 2014-09-24 NOTE — Telephone Encounter (Signed)
Patient is aware of normal labs  

## 2014-09-24 NOTE — Telephone Encounter (Signed)
Message copied by Fredirick MaudlinVAN DER GLAS, Filbert Craze E on Fri Sep 24, 2014  8:18 AM ------      Message from: JAFFE, ADAM R      Created: Fri Sep 24, 2014  6:27 AM       HIV testing is negative      ----- Message -----         From: [facility]         Sent: 09/23/2014   6:47 PM           To: Cira ServantAdam Robert Jaffe, DO                   ------

## 2014-09-27 NOTE — Telephone Encounter (Signed)
Patient returned my call, explained Dr. Ephriam Knucklesomer's response.  Patient is satisfied, will continue with care in rheumatology, will look into a hepatology referral for further investigation of his lab results.  Pt to  Hold Hep B and Hep A immunization series until he feels he has enough answers to resume. Andree CossHowell, Zella Dewan M, RN

## 2014-09-30 ENCOUNTER — Ambulatory Visit (INDEPENDENT_AMBULATORY_CARE_PROVIDER_SITE_OTHER): Payer: BC Managed Care – PPO | Admitting: Neurology

## 2014-09-30 DIAGNOSIS — R531 Weakness: Secondary | ICD-10-CM

## 2014-09-30 DIAGNOSIS — M791 Myalgia, unspecified site: Secondary | ICD-10-CM

## 2014-09-30 DIAGNOSIS — R202 Paresthesia of skin: Secondary | ICD-10-CM

## 2014-09-30 DIAGNOSIS — M2550 Pain in unspecified joint: Secondary | ICD-10-CM

## 2014-09-30 DIAGNOSIS — B182 Chronic viral hepatitis C: Secondary | ICD-10-CM

## 2014-09-30 DIAGNOSIS — G5601 Carpal tunnel syndrome, right upper limb: Secondary | ICD-10-CM

## 2014-09-30 DIAGNOSIS — G5621 Lesion of ulnar nerve, right upper limb: Secondary | ICD-10-CM

## 2014-09-30 DIAGNOSIS — R200 Anesthesia of skin: Secondary | ICD-10-CM

## 2014-09-30 NOTE — Procedures (Signed)
[facility]  [address], [address]  [place], [place] [address] Tel: [phone] Fax:  [phone] Test Date:  09/30/2014  Patient: Patrick ServerJohn Wallington DOB: 11-09-1956 Physician: Nita Sickleonika Mikia Delaluz, DO  Sex: Male Height: 6\' 4"  Ref Phys: Shon MilletJaffe, Adam  ID#: [id number] Temp: 32.0C Technician: Ala BentSusan Reid R. NCS T.   Patient Complaints: Patient is a 58 year old male here for evaluation of myalgias, numbness and weakness of bilateral feet.  NCV & EMG Findings: Extensive electrodiagnostic testing of the right upper and lower extremity shows:  1. Right median sensory response is prolonged and reduced in amplitude. The median motor response is prolonged with preserved amplitude. 2. Right ulnar sensory response is markedly reduced with borderline latency. The dorsal ulnar cutaneous sensory response is prolonged and reduced. The ulnar motor response recording at the abductor digiti minimi shows preserved amplitude and latency, however there is conduction velocity slowing across the elbow. The right ulnar motor nerve recording at the first dorsal interosseous shows prolonged distal latency. 3. The remaining sensory responses including the radial, sural, and superficial peroneal nerves are within normal limits. The tibial and peroneal motor responses also within normal limits. 4. F wave studies and H reflex studies are all prolonged, and may be partly secondary to patient's height. 5. Chronic motor axon loss changes are seen in affecting the abductor pollicis brevis, first dorsal interosseous, abductor digiti minimi, and flexor digitorum profundus 4 & 5 muscles. There is no accompanied active denervation.   Impression: 1. Right median neuropathy at or distal to the wrist consistent with the clinical diagnosis of carpal tunnel syndrome. Overall, these findings are moderate in degree electrically. 2. Right ulnar neuropathy with slowing across the elbow, demyelinating and axonal loss in type. 3. Prolonged  late responses may be due to patient's height; a polyradiculoneuropathy is thought to be less likely given absence of active changes in the cervical and lumbosacral paraspinal muscles.  Clinical correlation recommended. 4. There is no evidence of a generalized large fiber sensorimotor polyneuropathy, cervical radiculopathy or lumbosacral radiculopathy.   ___________________________ Nita Sickleonika Dylin Ihnen, DO    Nerve Conduction Studies Anti Sensory Summary Table   Site NR Peak (ms) Norm Peak (ms) P-T Amp (V) Norm P-T Amp  Right DorsCutan Anti Sensory (Dorsum 5th MC)  32C  Wrist    4.8 <3.1 6.3 >10  Right Median Anti Sensory (2nd Digit)  32C  Wrist    3.7 <3.6 12.0 >15  Right Radial Anti Sensory (Base 1st Digit)  Wrist    2.1 <2.7 23.2 >14  Right Sup Peroneal Anti Sensory (Ant Lat Mall)  12 cm    3.2 <4.6 7.1 >4  Right Sural Anti Sensory (Lat Mall)  Calf    4.3 <4.6 6.9 >4  Right Ulnar Anti Sensory (5th Digit)  32C  Wrist    3.1 <3.1 3.5 >10   Motor Summary Table   Site NR Onset (ms) Norm Onset (ms) O-P Amp (mV) Norm O-P Amp Site1 Site2 Delta-0 (ms) Dist (cm) Vel (m/s) Norm Vel (m/s)  Right Median Motor (Abd Poll Brev)  32C  Wrist    4.3 <4.0 6.8 >6 Elbow Wrist 5.0 29.5 59 >50  Elbow    9.3  6.0         Right Peroneal Motor (Ext Dig Brev)  Ankle    5.5 <6.0 4.4 >2.5 B Fib Ankle 9.3 37.0 40 >40  B Fib    14.8  4.1  Poplt B Fib 1.5 7.5 50 >40  Poplt    16.3  3.7         Right Peroneal TA Motor (Tib Ant)  Fib Head    4.5 <4.5 6.0 >3 Poplit Fib Head 1.4 7.0 50 >40  Poplit    5.9  5.7         Right Tibial Motor (Abd Hall Brev)  Ankle    4.7 <6.0 6.2 >4 Knee Ankle 10.1 47.0 47 >40  Knee    14.8  5.8         Right Ulnar Motor (Abd Dig Minimi)  32C  Wrist    2.7 <3.1 8.5 >7 B Elbow Wrist 4.6 27.0 59 >50  B Elbow    7.3  7.6  A Elbow B Elbow 2.5 10.0 40 >50  A Elbow    9.8  6.8         Right Ulnar (FDI) Motor (1st DI)  32C  Wrist    5.2 <4.5 7.9 >7         F Wave Studies   NR  F-Lat (ms) Lat Norm (ms) L-R F-Lat (ms)  Right Tibial (Mrkrs) (Abd Hallucis)  32C     64.65 <55   Right Ulnar (Mrkrs) (Abd Dig Min)  32C     37.45 <33    H Reflex Studies   NR H-Lat (ms) Lat Norm (ms) L-R H-Lat (ms)  Right Tibial (Gastroc)  32C     40.68 <35    EMG   Side Muscle Ins Act Fibs Psw Fasc Number Recrt Dur Dur. Amp Amp. Poly Poly. Comment  Right Flex Dig Long Nml Nml Nml Nml Nml Nml Nml Nml Nml Nml Nml Nml N/A  Right AntTibialis Nml Nml Nml Nml Nml Nml Nml Nml Nml Nml Nml Nml N/A  Right Gastroc Nml Nml Nml Nml Nml Nml Nml Nml Nml Nml Nml Nml N/A  Right RectFemoris Nml Nml Nml Nml Nml Nml Nml Nml Nml Nml Nml Nml N/A  Right GluteusMed Nml Nml Nml Nml Nml Nml Nml Nml Nml Nml Nml Nml N/A  Right BicepsFemS Nml Nml Nml Nml Nml Nml Nml Nml Nml Nml Nml Nml N/A  Right 1stDorInt Nml Nml Nml Nml 2- Mod-R Some 1+ Some 1+ Nml Nml N/A  Right Abd Poll Brev Nml Nml Nml Nml 1- Mod-R Few 1+ Nml Nml Nml Nml N/A  Right FlexPolLong Nml Nml Nml Nml Nml Nml Nml Nml Nml Nml Nml Nml N/A  Right Ext Indicis Nml Nml Nml Nml Nml Nml Nml Nml Nml Nml Nml Nml N/A  Right PronatorTeres Nml Nml Nml Nml Nml Nml Nml Nml Nml Nml Nml Nml N/A  Right Biceps Nml Nml Nml Nml Nml Nml Nml Nml Nml Nml Nml Nml N/A  Right Triceps Nml Nml Nml Nml 1- Mod-V Nml Nml Nml Nml Nml Nml N/A  Right Deltoid Nml Nml Nml Nml 1- Mod-V Nml Nml Nml Nml Nml Nml N/A  Right Cervical Parasp Low Nml Nml Nml Nml NE - - - - - - - N/A  Right FlexDigProf 4,5 Nml Nml Nml Nml 1- Mod-R Few 1+ Nml Nml Nml Nml N/A  Right ABD Dig Min Nml Nml Nml Nml 1- Mod-V Few 1+ Nml Nml Nml Nml N/A      Waveforms:

## 2014-10-01 ENCOUNTER — Telehealth: Payer: Self-pay | Admitting: *Deleted

## 2014-10-01 ENCOUNTER — Other Ambulatory Visit (INDEPENDENT_AMBULATORY_CARE_PROVIDER_SITE_OTHER): Payer: BC Managed Care – PPO

## 2014-10-01 ENCOUNTER — Ambulatory Visit (INDEPENDENT_AMBULATORY_CARE_PROVIDER_SITE_OTHER): Payer: BC Managed Care – PPO | Admitting: Family

## 2014-10-01 ENCOUNTER — Encounter: Payer: Self-pay | Admitting: Family

## 2014-10-01 VITALS — BP 160/102 | HR 92 | Temp 98.1°F | Resp 18 | Ht 76.0 in | Wt 291.0 lb

## 2014-10-01 DIAGNOSIS — G4700 Insomnia, unspecified: Secondary | ICD-10-CM

## 2014-10-01 DIAGNOSIS — IMO0001 Reserved for inherently not codable concepts without codable children: Secondary | ICD-10-CM

## 2014-10-01 DIAGNOSIS — R61 Generalized hyperhidrosis: Secondary | ICD-10-CM

## 2014-10-01 LAB — TSH: TSH: 1.3 u[IU]/mL (ref 0.35–4.50)

## 2014-10-01 MED ORDER — ALPRAZOLAM 0.5 MG PO TABS: 0.5000 mg | ORAL_TABLET | Freq: Every evening | ORAL | Status: DC | PRN

## 2014-10-01 NOTE — Progress Notes (Signed)
   Subjective:    Patient ID: [name], male    DOB: [birth year], 58 y.o.   MRN: [id number]  Chief Complaint  Patient presents with  . Follow-up    still having alot of symptoms    HPI:  [name] is a 58 y.o. male who presents today for follow up of multiple symptoms.   Since his last visit he has been seen by Dr. Everlena CooperJaffe of neurology who ordered a EMG for which the results are pending. He will follow up after the results - discussed potential for a lumbar puncture. Also labs forwarded to this office were reviewed and showed an exponentially high Hepatitis C level. He continues to have multiple symptoms including lack of sleep, ringing in his ears, excessive sweating, tachycardia, high blood pressure and occasional shortness of breath and numbness in the knees and feet. There is nothing that makes these symptoms better or worse. There have been no new attempts at treatment at this point. Most distressing to the patient right now is having a limited amount of sleep at night. He has also taken out short term disability.   No Known Allergies  Current Outpatient Prescriptions on File Prior to Visit  Medication Sig Dispense Refill  . cyclobenzaprine (FLEXERIL) 10 MG tablet     . doxycycline (VIBRA-TABS) 100 MG tablet Take 1 tablet (100 mg total) by mouth 2 (two) times daily. 20 tablet 0  . lisdexamfetamine (VYVANSE) 20 MG capsule Take 20 mg by mouth daily.    . Oxycodone HCl 10 MG TABS     . predniSONE (DELTASONE) 10 MG tablet Take 60 mg by mouth daily at 6 (six) AM.    . ribavirin (REBETOL) 200 MG capsule Take 3 capsules (600 mg total) by mouth 2 (two) times daily. 168 capsule 5  . Sofosbuvir 400 MG TABS Take 1 tablet by mouth daily. 28 tablet 5   No current facility-administered medications on file prior to visit.   Past Medical History  Diagnosis Date  . Arthritis   . Hepatitis C   . Personal history of adenomatous colonic polyps 08/05/2012    8 mm tubulovillous adenoma 2010  08/05/2012 no polyps - next routine exam 2018    . Pneumonia age 58    hx of  . Hiatal hernia    Review of Systems     See HPI Objective:    BP 160/102 mmHg  Pulse 92  Temp(Src) 98.1 F (36.7 C) (Oral)  Resp 18  Ht 6\' 4"  (1.93 m)  Wt 291 lb (131.997 kg)  BMI 35.44 kg/m2  SpO2 98% Nursing note and vital signs reviewed.  Physical Exam  Constitutional: He is oriented to person, place, and time. He appears well-developed and well-nourished.  Seated in the chair appears, tired, sweaty; dressed appropriately, and appears stated age. He appears to be in a mild amount of distress.   Cardiovascular: Normal rate, regular rhythm, normal heart sounds and intact distal pulses.   Pulmonary/Chest: Effort normal and breath sounds normal.  Neurological: He is alert and oriented to person, place, and time.  Skin: Skin is warm and dry.  Psychiatric: He has a normal mood and affect. His behavior is normal. Judgment and thought content normal.       Assessment & Plan:

## 2014-10-01 NOTE — Telephone Encounter (Signed)
Patient is aware of EMG results  

## 2014-10-01 NOTE — Progress Notes (Signed)
Pre visit review using our clinic review tool, if applicable. No additional management support is needed unless otherwise documented below in the visit note. 

## 2014-10-01 NOTE — Assessment & Plan Note (Addendum)
TSH has never been tested. Obtain TSH to rule out thyroid issues/disease. Cannot rule out these symptoms related to hepatitis C. [facility] results. Also discussed potential for sending him to a Hep C liver center. He will investigate. Will continue to follow.   LAB RESULT: TSH normal.

## 2014-10-01 NOTE — Assessment & Plan Note (Signed)
Related to the symptoms he is currently experiencing. Start Xanax as needed at night for sleep. Follow up if symptoms worsen or fail to improve.

## 2014-10-01 NOTE — Patient Instructions (Addendum)
Thank you for choosing [facility].  Summary/Instructions:   Please stop by the lab prior to leaving for your blood work.  Please research major medical centers for Hep C programs and we can consider sending to one of them.   Please start taking the Xanax for sleep as needed.  We will follow up once the lab results come back from the nerve conduction and possible lumbar puncture.

## 2014-10-02 ENCOUNTER — Telehealth: Payer: Self-pay | Admitting: Family

## 2014-10-02 NOTE — Telephone Encounter (Signed)
Please call the patient and let him know that his TSH was 1.3 which was within the normal range, so thyroid is not an issue. Also please ask him if he was able to sleep with the Xanax that was prescribed.

## 2014-10-05 NOTE — Telephone Encounter (Signed)
Xanax is helping but he has to take 2 not 1 at a time. He would like to know if he can get a refill soon since he is taking more than expected. He is aware his thyroid is not the issue. Please advise

## 2014-10-06 NOTE — Telephone Encounter (Signed)
Pt called concerned about his paper work that needs to be faxed to his job about disability. I reassured him that Tammy SoursGreg will do his best to get paper work filled out tomorrow. He also wanted to know about xanax refill. I told him that Tammy SoursGreg was going to wait closer to the 2 week mark of him being done with the first round then we will have another rx for him to pick up here. Pt understood.

## 2014-10-07 ENCOUNTER — Telehealth: Payer: Self-pay

## 2014-10-07 NOTE — Telephone Encounter (Signed)
Called pt to let him know his paper work for disability was faxed over to the number given on the forms.

## 2014-10-14 ENCOUNTER — Encounter: Payer: Self-pay | Admitting: Rheumatology

## 2014-10-19 ENCOUNTER — Ambulatory Visit (INDEPENDENT_AMBULATORY_CARE_PROVIDER_SITE_OTHER): Payer: BC Managed Care – PPO | Admitting: Family

## 2014-10-19 ENCOUNTER — Encounter: Payer: Self-pay | Admitting: Family

## 2014-10-19 ENCOUNTER — Encounter (INDEPENDENT_AMBULATORY_CARE_PROVIDER_SITE_OTHER): Payer: Self-pay

## 2014-10-19 VITALS — BP 142/90 | HR 96 | Temp 98.3°F | Resp 18 | Ht 76.0 in | Wt 297.0 lb

## 2014-10-19 DIAGNOSIS — B349 Viral infection, unspecified: Secondary | ICD-10-CM

## 2014-10-19 DIAGNOSIS — G4700 Insomnia, unspecified: Secondary | ICD-10-CM

## 2014-10-19 MED ORDER — ALPRAZOLAM 0.5 MG PO TABS: 0.5000 mg | ORAL_TABLET | Freq: Every evening | ORAL | Status: DC | PRN

## 2014-10-19 NOTE — Assessment & Plan Note (Signed)
Potentially related to Hepatitis C. Appears to be gradually improving at a slow rate. Discussed in detail with the patient regarding the next steps. Agree with Dr. Kellie Simmeringruslow that patient should speak with Infectious Disease regarding treatment for Hepatitis C or evaluation for it. Also to discuss possibility for further evaluation by liver specialist at one of the academic medical centers. Continue current medications and follow up as needed.

## 2014-10-19 NOTE — Progress Notes (Signed)
   Subjective:    Patient ID: [name], male    DOB: [birth year], 58 y.o.   MRN: [id number]  Chief Complaint  Patient presents with  . Follow-up    Still having alot of the same symptoms    HPI:  [name] is a 58 y.o. male who presents today for follow up.   Since he was previously seen, he indicates that he is walking better, but still having trouble with stairs and numbness and tingling of his feet. The sweating has improved and the Xanax is helping him to sleep at night. He continues to experience numbness in his hands which according to recent nerve conduction may be the result carpel tunnel. Indicates he is still having occasional increased heart rate and continues to feel fatigued.   Recently met with Dr. Charlestine Night of Rheumatology and it is believed that his current symptoms are related to reactivation of his Hepatitis C. It appears as though this viral-like syndrome may have been activated by the vaccination that he received in relation to Hepatitis C.  Blood work, medical notes, and documentation by Neurology and Rheumatology has been reviewed in detail.   No Known Allergies  Current Outpatient Prescriptions on File Prior to Visit  Medication Sig Dispense Refill  . cyclobenzaprine (FLEXERIL) 10 MG tablet     . doxycycline (VIBRA-TABS) 100 MG tablet Take 1 tablet (100 mg total) by mouth 2 (two) times daily. 20 tablet 0  . lisdexamfetamine (VYVANSE) 20 MG capsule Take 20 mg by mouth daily.    . Oxycodone HCl 10 MG TABS     . predniSONE (DELTASONE) 10 MG tablet Take 60 mg by mouth daily at 6 (six) AM.    . ribavirin (REBETOL) 200 MG capsule Take 3 capsules (600 mg total) by mouth 2 (two) times daily. 168 capsule 5  . Sofosbuvir 400 MG TABS Take 1 tablet by mouth daily. 28 tablet 5   No current facility-administered medications on file prior to visit.   Past Medical History  Diagnosis Date  . Arthritis   . Hepatitis C   . Personal history of adenomatous colonic polyps  08/05/2012    8 mm tubulovillous adenoma 2010 08/05/2012 no polyps - next routine exam 2018    . Pneumonia age 54    hx of  . Hiatal hernia     Review of Systems     Objective:    BP 142/90 mmHg  Pulse 96  Temp(Src) 98.3 F (36.8 C) (Oral)  Resp 18  Ht _0  (1.93 m)  Wt 297 lb (134.718 kg)  BMI 36.17 kg/m2  SpO2 98% Nursing note and vital signs reviewed.  Physical Exam  Constitutional: He is oriented to person, place, and time. He appears well-developed and well-nourished. No distress.  Appears fatigued, appropriately dress, and appears his stated age.   Cardiovascular: Normal rate, regular rhythm, normal heart sounds and intact distal pulses.   Pulmonary/Chest: Effort normal and breath sounds normal.  Musculoskeletal:  Numbness and tingling along foot, able to walk.   Neurological: He is alert and oriented to person, place, and time.  Skin: Skin is warm and dry.  Psychiatric: He has a normal mood and affect. His behavior is normal. Judgment and thought content normal.       Assessment & Plan:   NOTE: Spent 35 minutes with patient discussing the treatment options and >20 minutes was spent in counseling regarding symptom management and continuing the treatment plan.

## 2014-10-19 NOTE — Assessment & Plan Note (Signed)
Appears controlled with current Xanax. Continue Xanax as needed for sleep.

## 2014-10-19 NOTE — Patient Instructions (Signed)
Thank you for choosing [facility].  Summary/Instructions:  Continue current medications as prescribed  Contact infectious disease and obtain their recommendations on treatment of the Hepatitis C and/or also the possibility of referral to a liver specialist at one of the academic medical centers.

## 2014-10-19 NOTE — Progress Notes (Signed)
Pre visit review using our clinic review tool, if applicable. No additional management support is needed unless otherwise documented below in the visit note. 

## 2014-10-26 ENCOUNTER — Telehealth: Payer: Self-pay | Admitting: *Deleted

## 2014-10-26 NOTE — Telephone Encounter (Signed)
Patient has an appointment with Dr. Luciana Axeomer on 10/28/14 to discuss concerns he has. [name]

## 2014-10-26 NOTE — Telephone Encounter (Signed)
-----   Message from Gardiner Barefootobert W Comer, MD sent at 10/26/2014  1:49 PM EST ----- Could you let him know to go ahead and start his hep C medications.  I have discussed this with Dr. Kellie Simmeringruslow of rheumatology who is in agreement.  He should get a cbc, cmp 2 weeks after starting (due to ribavirin), ok to take with his meds including prednisone and follow up with me the first of January.  He could see the pharmacist in 2 weeks when he comes in for labs if he has any concerns.   Thanks

## 2014-10-28 ENCOUNTER — Encounter: Payer: Self-pay | Admitting: Rheumatology

## 2014-10-28 ENCOUNTER — Ambulatory Visit (INDEPENDENT_AMBULATORY_CARE_PROVIDER_SITE_OTHER): Payer: BLUE CROSS/BLUE SHIELD | Admitting: Internal Medicine

## 2014-10-28 ENCOUNTER — Encounter: Payer: Self-pay | Admitting: Internal Medicine

## 2014-10-28 VITALS — BP 152/97 | HR 97 | Temp 98.2°F | Ht 76.0 in | Wt 295.0 lb

## 2014-10-28 DIAGNOSIS — B182 Chronic viral hepatitis C: Secondary | ICD-10-CM

## 2014-10-28 DIAGNOSIS — B349 Viral infection, unspecified: Secondary | ICD-10-CM | POA: Diagnosis not present

## 2014-10-28 NOTE — Assessment & Plan Note (Signed)
Seems to be improving from his presumed viral illness.  Weaning prednisone per Dr. Kellie Simmeringruslow.

## 2014-10-28 NOTE — Assessment & Plan Note (Addendum)
He will try to get his surgery soon and then start his meds for hep c.  He will call when he starts and we will get a CBC and CMP after 2 weeks then cbc, cmp and HCV viral load after 4 weeks and I will see him at around 5 weeks or sooner if there are any issues.  24 weeks and will need to appeal after 12 weeks.

## 2014-10-28 NOTE — Progress Notes (Signed)
   Subjective:    Patient ID: Alden ServerJohn Dains, male    DOB: 04-Jul-1956, [age] y.o.   MRN: [id number]  HPI Here for follow up of HCV.  Genotype 2, viral load 35,835.  Seen by my partner after his initial visit with joint complaints and referred to rheumatology.  Noted significantly elevated CK and ALT/AST.  Started on Prednisone, now weaning and getting better.  No etiology identified. Also to have carpal tunnel surgery.  Approved for treatment with Sovaldi and ribavirin.  Would like to start after getting the surgery.    Review of Systems  Constitutional: Positive for fatigue.  Gastrointestinal: Negative for diarrhea.  Musculoskeletal: Positive for myalgias.       Improved overall  Neurological: Negative for dizziness and light-headedness.       Objective:   Physical Exam  Constitutional: He appears well-developed and well-nourished. No distress.  Eyes: No scleral icterus.  Cardiovascular: Normal rate, regular rhythm and normal heart sounds.   No murmur heard. Pulmonary/Chest: Effort normal and breath sounds normal. No respiratory distress.  Skin: No rash noted.          Assessment & Plan:

## 2014-10-30 ENCOUNTER — Encounter: Payer: Self-pay | Admitting: Family

## 2014-11-04 ENCOUNTER — Other Ambulatory Visit: Payer: Self-pay | Admitting: Family

## 2014-11-04 DIAGNOSIS — M2550 Pain in unspecified joint: Secondary | ICD-10-CM

## 2014-11-04 DIAGNOSIS — R531 Weakness: Secondary | ICD-10-CM

## 2014-11-10 ENCOUNTER — Telehealth: Payer: Self-pay | Admitting: Neurology

## 2014-11-10 ENCOUNTER — Other Ambulatory Visit (INDEPENDENT_AMBULATORY_CARE_PROVIDER_SITE_OTHER): Payer: BLUE CROSS/BLUE SHIELD

## 2014-11-10 DIAGNOSIS — B182 Chronic viral hepatitis C: Secondary | ICD-10-CM | POA: Diagnosis not present

## 2014-11-10 LAB — CBC WITH DIFFERENTIAL/PLATELET
Basophils Absolute: 0.1 10*3/uL (ref 0.0–0.1)
Basophils Relative: 1 % (ref 0–1)
Eosinophils Absolute: 0.1 10*3/uL (ref 0.0–0.7)
Eosinophils Relative: 2 % (ref 0–5)
HCT: 38.5 % — ABNORMAL LOW (ref 39.0–52.0)
Hemoglobin: 13.4 g/dL (ref 13.0–17.0)
Lymphocytes Relative: 28 % (ref 12–46)
Lymphs Abs: 2 10*3/uL (ref 0.7–4.0)
MCH: 31.9 pg (ref 26.0–34.0)
MCHC: 34.8 g/dL (ref 30.0–36.0)
MCV: 91.7 fL (ref 78.0–100.0)
MPV: 9.5 fL (ref 9.4–12.4)
Monocytes Absolute: 0.8 10*3/uL (ref 0.1–1.0)
Monocytes Relative: 11 % (ref 3–12)
Neutro Abs: 4.1 10*3/uL (ref 1.7–7.7)
Neutrophils Relative %: 58 % (ref 43–77)
Platelets: 252 10*3/uL (ref 150–400)
RBC: 4.2 MIL/uL — ABNORMAL LOW (ref 4.22–5.81)
RDW: 13.9 % (ref 11.5–15.5)
WBC: 7 10*3/uL (ref 4.0–10.5)

## 2014-11-10 LAB — COMPLETE METABOLIC PANEL WITH GFR
ALT: 17 U/L (ref 0–53)
AST: 11 U/L (ref 0–37)
Albumin: 4.2 g/dL (ref 3.5–5.2)
Alkaline Phosphatase: 47 U/L (ref 39–117)
BUN: 14 mg/dL (ref 6–23)
CO2: 27 mEq/L (ref 19–32)
Calcium: 8.9 mg/dL (ref 8.4–10.5)
Chloride: 103 mEq/L (ref 96–112)
Creat: 0.96 mg/dL (ref 0.50–1.35)
GFR, Est African American: >89 mL/min
GFR, Est Non African American: 88 mL/min
Glucose, Bld: 78 mg/dL (ref 70–99)
Potassium: 3.8 mEq/L (ref 3.5–5.3)
Sodium: 138 mEq/L (ref 135–145)
Total Bilirubin: 3.7 mg/dL — ABNORMAL HIGH (ref 0.2–1.2)
Total Protein: 6.8 g/dL (ref 6.0–8.3)

## 2014-11-10 NOTE — Telephone Encounter (Signed)
Pt called wanting to speak to a nurse regarding his condition. Pt feels that his symptoms are getting worse and  Would like to speak to a nurse to see what is next. C/B [phone]

## 2014-11-10 NOTE — Telephone Encounter (Signed)
Patient called stating he is feeling really bad again he says he is still have numbness in feet he is wondering what  Options he has moving forward with this ?

## 2014-11-11 NOTE — Telephone Encounter (Signed)
If he is feeling burning pain or uncomfortable tingling, then we can try gabapentin 300mg  three times daily.  If he means numbness, as in loss of sensation, there isn't anything to treat that.  He was supposed to follow up after the EMG.

## 2014-11-12 ENCOUNTER — Telehealth: Payer: Self-pay

## 2014-11-12 NOTE — Telephone Encounter (Signed)
Sent pts paper work from last visit over to [employer] per pts request.

## 2014-11-12 NOTE — Telephone Encounter (Signed)
Pt called in and had a few question about disability paperwork and his high blood pressure.    Best number [phone]

## 2014-11-12 NOTE — Telephone Encounter (Signed)
It is numbness if feet per patient . I have made an appt for him for a follow up Monday 11/15/14  He id not have a follow up Appt after the EMG was done .

## 2014-11-15 ENCOUNTER — Encounter: Payer: Self-pay | Admitting: Family

## 2014-11-15 ENCOUNTER — Encounter: Payer: Self-pay | Admitting: Neurology

## 2014-11-15 ENCOUNTER — Ambulatory Visit (INDEPENDENT_AMBULATORY_CARE_PROVIDER_SITE_OTHER): Payer: BC Managed Care – PPO | Admitting: Family

## 2014-11-15 ENCOUNTER — Ambulatory Visit (INDEPENDENT_AMBULATORY_CARE_PROVIDER_SITE_OTHER): Payer: BC Managed Care – PPO | Admitting: Neurology

## 2014-11-15 VITALS — BP 128/78 | HR 68 | Temp 98.8°F | Resp 16 | Ht 76.0 in | Wt 290.6 lb

## 2014-11-15 VITALS — BP 120/82 | HR 71 | Temp 98.0°F | Resp 18 | Ht 76.0 in | Wt 301.2 lb

## 2014-11-15 DIAGNOSIS — R61 Generalized hyperhidrosis: Secondary | ICD-10-CM

## 2014-11-15 DIAGNOSIS — M791 Myalgia, unspecified site: Secondary | ICD-10-CM

## 2014-11-15 DIAGNOSIS — B349 Viral infection, unspecified: Secondary | ICD-10-CM

## 2014-11-15 DIAGNOSIS — M2550 Pain in unspecified joint: Secondary | ICD-10-CM

## 2014-11-15 DIAGNOSIS — IMO0001 Reserved for inherently not codable concepts without codable children: Secondary | ICD-10-CM

## 2014-11-15 DIAGNOSIS — R202 Paresthesia of skin: Secondary | ICD-10-CM

## 2014-11-15 DIAGNOSIS — R200 Anesthesia of skin: Secondary | ICD-10-CM

## 2014-11-15 DIAGNOSIS — R0789 Other chest pain: Secondary | ICD-10-CM

## 2014-11-15 NOTE — Assessment & Plan Note (Signed)
Viral syndrome appears to be improving. This is the best the patient has looked since he has started treatment. Review of his last lab work showed normalizing numbers. Continue treatments per Dr. Luciana Axeomer.

## 2014-11-15 NOTE — Assessment & Plan Note (Signed)
Continues to experience lower extremity arthralgia. Has appointment with orthopedics in January for further assessment. Neurology indicates normal nerve conduction and an potential good prognosis for recovery of nerve function. Continue to monitor at this time.

## 2014-11-15 NOTE — Progress Notes (Signed)
Subjective:    Patient ID: [name], male    DOB: 22-Nov-1956, 58 y.o.   MRN: [id number]  Chief Complaint  Patient presents with  . Follow-up    Says he is doing better, still having trouble walking and feeling numbness in feet, was also concerned about BP being high on and off    HPI:  [name] is a 58 y.o. male who presents today for follow up .  Since his last visit he has been seen by both infectious disease and neurology. He has restarted treatment for hepatitis C and since that time has felt generalized improvements. He was also referred for physical therapy alert extremity which is still pending. He continues to have lower extremity weakness and numbness and tingling in his feet and lower extremities. Neurology is associating this with an acute viral syndrome that appears to be improving. Patient declined gabapentin at this time but will consider at his follow-up with neurology in 4 weeks.   He also indicates that he is experiencing some muscle and joint pain in bilateral lower extremities. States he is scheduled to see Dr. Despina HickAlusio in January for a consult for his lower extremities.  He most recently underwent a cubital and carpal tunnel release for which she is wearing a sling and support 4. He is scheduled to start rehabilitation in approximate 6-8 weeks. Has also had a carpal tunnel release and cubital tunnel surgery. Starts rehabilitation for his arm in about 6-8 weeks under the direction of Dr. Melvyn Novasrtmann.   He continues to have difficulty working secondary to the traveling aspect. Due to the numbness and weakness in his legs and the recent surgery, it is not feasable at this time to return to normal job functions.   No Known Allergies  Current Outpatient Prescriptions on File Prior to Visit  Medication Sig Dispense Refill  . ALPRAZolam (XANAX) 0.5 MG tablet Take 1-2 tablets (0.5-1 mg total) by mouth at bedtime as needed for anxiety or sleep. 60 tablet 0  .  cyclobenzaprine (FLEXERIL) 10 MG tablet     . furosemide (LASIX) 20 MG tablet   0  . lisdexamfetamine (VYVANSE) 20 MG capsule Take 20 mg by mouth daily.    . Oxycodone HCl 10 MG TABS     . predniSONE (DELTASONE) 10 MG tablet Take 60 mg by mouth daily at 6 (six) AM.    . ribavirin (REBETOL) 200 MG capsule Take 3 capsules (600 mg total) by mouth 2 (two) times daily. 168 capsule 5  . Sofosbuvir 400 MG TABS Take 1 tablet by mouth daily. 28 tablet 5  . VYVANSE 30 MG capsule     . VYVANSE 60 MG capsule     . zolpidem (AMBIEN) 10 MG tablet   0   No current facility-administered medications on file prior to visit.    Past Medical History  Diagnosis Date  . Arthritis   . Hepatitis C   . Personal history of adenomatous colonic polyps 08/05/2012    8 mm tubulovillous adenoma 2010 08/05/2012 no polyps - next routine exam 2018    . Pneumonia age 58    hx of  . Hiatal hernia     Review of Systems    See HPI  Objective:    BP 120/82 mmHg  Pulse 71  Temp(Src) 98 F (36.7 C) (Oral)  Resp 18  Ht 6\' 4"  (1.93 m)  Wt 301 lb 3.2 oz (136.623 kg)  BMI 36.68 kg/m2  SpO2 98% Nursing note  and vital signs reviewed.  Physical Exam  Constitutional: He is oriented to person, place, and time. He appears well-developed and well-nourished. No distress.  Seated in chair, right arm is noted to be in a sling; dressed appropriately; appears his stated age; no apparent distress and obvious sweating; patient looks content  Cardiovascular: Normal rate, regular rhythm, normal heart sounds and intact distal pulses.   Pulmonary/Chest: Effort normal and breath sounds normal.  Neurological: He is alert and oriented to person, place, and time.  Skin: Skin is warm and dry.  Psychiatric: He has a normal mood and affect. His behavior is normal. Judgment and thought content normal.      Assessment & Plan:

## 2014-11-15 NOTE — Assessment & Plan Note (Signed)
This is most likely related to anxiety. Symptoms have continued to resolve with decreasing stress levels. There is no indication for cardiac workup at this time. If symptoms worsen or fail to improve will obtain EKG and further cardiac workup.

## 2014-11-15 NOTE — Patient Instructions (Signed)
Thank you for choosing [facility].  Summary/Instructions:  Blood pressure today is good and there is no indication at this time that blood pressure medication is needed.   As for the tightness in your chest, it is most likely related to the anxiety that you are experiencing. This is evidenced by the stress levels coming down and a decrease in the levels of symptoms. If you continue to experience symptoms we can obtain an EKG and do further work-up.   Continue taking your medication as prescribed and follow up as needed.

## 2014-11-15 NOTE — Progress Notes (Signed)
NEUROLOGY FOLLOW UP OFFICE NOTE  Patrick ServerJohn Boswell [id number]  HISTORY OF PRESENT ILLNESS: [name] is a 58 year old right-handed man with chronic hepatitis C, carpal tunnel syndrome, cubital tunnel syndrome, bilateral hip replacements, and arthritis who follows up for post-viral illness.  UPDATE: NCV-EMG performed on 09/30/14 showed right carpal tunnel syndrome and ulnar neuropathy at the elbow, but was otherwise negative for a polyneuropathy. 09/23/14 LABS:  HIV negative 10/01/14 LABS:  TSH 1.30 10/07/14 LABS:  ALP 48, AST 49, ALT 240, LDH 150, CK 56 11/10/14 LABS:  WBC 7, HGB 13.4, HCT 38.5, PLT 252, TB 3.7, ALP 47, AST 11, ALT 17, TP 6.8.  He is tapering off the prednisone as prescribed by Dr. Kellie Simmeringruslow, his rheumatologist.  He has followed up with Dr. Luciana Axeomer, his ID doctor and has started Sovaldi and ribavirin.  He had right carpal tunnel and cubital tunnel syndrome.  CK and LFTs have improved.  He still notes muscle and joint pain.  He still has both numbness and discomfort as well as reduced range of motion in his feet.  He still reports some fatigue.  He reports that his blood pressure has been elevated more frequently as of late and mild chest discomfort.  HISTORY: On 09/03/14, he developed sudden onset of severe pain involving his ankles and toes which quickly spread to include his hands and wrists.  He also had associated right upper quadrant pain.  He also began experiencing sweating throughout the day.  There was no associated redness, heat or swelling.  He developed increase shearing pain in the calves.  The pain got so severe, that he had trouble walking.  He applied ice and heat, which helped, as well as ibuprofen and flexeril.  He was started on prednisone and oxycodone, which has helped the pain.  He continues on prednisone 60mg .  He has trouble sleeping.  He had been experiencing fatigue and some mild dyspnea.  He also reports numbness in the hands and feet.  He says that he has  sensation of tightness and weakness in his hands and legs.  There was a question that he may have had a tick bite, so he was started on doxycycline.  Labs from 09/07/14 showed ANA negative, CRP negative, Sed rate 1, and uric acid 6.2.  WBC was 8.6, HGB 16.3, HCT 45.5, PLT 223, Na 139, K 3.8, BUN 15, Cr 0.87, TB 1.8, TP 7.2, ALP 54, AST 22, and ALT 54.  Cryoglobulin was positive.  Lyme titer was negative, so the doxycycline was discontinued.  He was evaluated by Dr. Kellie Simmeringruslow, a rheumatologist.  .  Labs from 09/15/14 showed that CBC was 9.7, HGB 16, HCT 40.9, PLT 247.  LFTs showed ALP 50, AST 107, ALT 350, total bil 1.7.  LDH was 288.  CK was 1116.  He had repeat lab tests performed on 09/21/14, which showed ALP 53, AST 64, ALT 415, LDH 234, and CK 163.  HCV RNA quant was 1,596, 652 (it was 35, 835 in August).  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  He is concerned regarding his ability to perform his job.  He denies any preceding illness but had Hep A, B and pneumonia vaccine on 08/25/14.  He has not had any fevers and has not travelled outside the [place].  Arthralgias and myalgias have resolved, but he still has sensation of tightness in the muscles, numbness and weakness in the extremities.  He does have a history of carpal  tunnel syndrome, cubital tunnel syndrome and bilateral hip replacement surgery.  He has a history of chronic Hepatitis C of indeterminate cause, diagnosed 15 years ago.  He had been on interferon and ribavirin for many years, but was discontinued due to side effects.  He established care with infectious disease at the end of September and was supposed to start sofosbuvir and ribavirin, which has been on-hold due to waiting for approval.   During the summer, he was started on Vyvance, but stopped when the symptoms began.  Hepatitis panel from 07/02/14 showed Hep A ab non-reactive, Hep B core ab non-reactive,  Hep B surface ab negative, Hep B surface ag negative, Hep  C genotype 2, HCV quantative 35835, HCV quantitative log 4.55.  He had an abdominal ultrasound on 07/23/14, which showed a median hepatic shear wave velocity of 2.69 m/sec and Metavir fibrosis score of F 4.  PAST MEDICAL HISTORY: Past Medical History  Diagnosis Date  . Arthritis   . Hepatitis C   . Personal history of adenomatous colonic polyps 08/05/2012    8 mm tubulovillous adenoma 2010 08/05/2012 no polyps - next routine exam 2018    . Pneumonia age 58    hx of  . Hiatal hernia     MEDICATIONS: Current Outpatient Prescriptions on File Prior to Visit  Medication Sig Dispense Refill  . cyclobenzaprine (FLEXERIL) 10 MG tablet     . lisdexamfetamine (VYVANSE) 20 MG capsule Take 20 mg by mouth daily.    . Oxycodone HCl 10 MG TABS     . ribavirin (REBETOL) 200 MG capsule Take 3 capsules (600 mg total) by mouth 2 (two) times daily. 168 capsule 5  . Sofosbuvir 400 MG TABS Take 1 tablet by mouth daily. 28 tablet 5  . ALPRAZolam (XANAX) 0.5 MG tablet Take 1-2 tablets (0.5-1 mg total) by mouth at bedtime as needed for anxiety or sleep. (Patient not taking: Reported on 11/15/2014) 60 tablet 0  . furosemide (LASIX) 20 MG tablet   0  . predniSONE (DELTASONE) 10 MG tablet Take 60 mg by mouth daily at 6 (six) AM.     No current facility-administered medications on file prior to visit.    ALLERGIES: No Known Allergies  FAMILY HISTORY: Family History  Problem Relation Age of Onset  . Colon cancer Neg Hx   . Esophageal cancer Neg Hx   . Stomach cancer Neg Hx   . Rectal cancer Neg Hx   . Clotting disorder Father   . Stroke Father     SOCIAL HISTORY: History   Social History  . Marital Status: Married    Spouse Name: N/A    Number of Children: 2  . Years of Education: N/A   Occupational History  . Sales    Social History Main Topics  . Smoking status: Never Smoker   . Smokeless tobacco: Never Used  . Alcohol Use: No  . Drug Use: No  . Sexual Activity:    Partners: Female    Other Topics Concern  . Not on file   Social History Narrative   married, 2 sons and 2 daughters employed in [employer]. One caffeinated beverage daily    REVIEW OF SYSTEMS: Constitutional: No fevers, chills, or sweats, no generalized fatigue, change in appetite Eyes: No visual changes, double vision, eye pain Ear, nose and throat: No hearing loss, ear pain, nasal congestion, sore throat Cardiovascular: No chest pain, palpitations Respiratory:  No shortness of breath at rest or with exertion, wheezes GastrointestinaI:  No nausea, vomiting, diarrhea, abdominal pain, fecal incontinence Genitourinary:  No dysuria, urinary retention or frequency Musculoskeletal:  No neck pain, back pain Integumentary: No rash, pruritus, skin lesions Neurological: as above Psychiatric: No depression, insomnia, anxiety Endocrine: No palpitations, fatigue, diaphoresis, mood swings, change in appetite, change in weight, increased thirst Hematologic/Lymphatic:  No anemia, purpura, petechiae. Allergic/Immunologic: no itchy/runny eyes, nasal congestion, recent allergic reactions, rashes  PHYSICAL EXAM: Filed Vitals:   11/15/14 1007  BP: 128/78  Pulse: 68  Temp: 98.8 F (37.1 C)  Resp: 16   General: No acute distress Head:  Normocephalic/atraumatic Eyes:  Fundoscopic exam unremarkable without vessel changes, exudates, hemorrhages or papilledema. Neck: supple, no paraspinal tenderness, full range of motion Heart:  Regular rate and rhythm Lungs:  Clear to auscultation bilaterally Back: No paraspinal tenderness Neurological Exam: alert and oriented to person, place, and time. Attention span and concentration intact, recent and remote memory intact, fund of knowledge intact.  Speech fluent and not dysarthric, language intact.  CN II-XII intact. Fundoscopic exam unremarkable without vessel changes, exudates, hemorrhages or papilledema.  Bulk and tone normal, muscle strength 5/5 throughout.  Sensation to light  touch, temperature and vibration intact.  Deep tendon reflexes 2+ throughout, toes downgoing.  Finger to nose and heel to shin testing intact.  Gait normal, Romberg negative.  IMPRESSION: Acute viral syndrome, presenting with myalgias, arthralgias and numbness. The fact that the NCV revealed no electrodiagnostic evidence of polyneuropathy, as well as normalization of CK, suggests good prognosis and symptoms should continue to improve.  It is frustrating for [name] since he is not feeling better as fast as he would like.  PLAN: 1.  He reports that he will be starting some PT next month 2.  He will consider gabapentin for discomfort in his feet. 3.  He is scheduled to see his PCP today.  He is advised to talk with him regarding blood pressure and mild chest tightness. 4.  Follow up in 4 weeks.  15 minutes spent with patient, over 50% spent discussing diagnosis, prognosis and plan.  Shon Millet, DO  CC:  Jeanine Luz, MD  Stacey Drain, MD

## 2014-11-15 NOTE — Patient Instructions (Signed)
I would continue to monitor for now.  She how physical therapy goes.  When you return in 4 weeks, we can consider medication for nerve discomfort such as gabapentin.  Discuss with your PCP regarding chest tightness.

## 2014-11-15 NOTE — Progress Notes (Signed)
Pre visit review using our clinic review tool, if applicable. No additional management support is needed unless otherwise documented below in the visit note. 

## 2014-11-23 ENCOUNTER — Ambulatory Visit: Payer: BC Managed Care – PPO | Admitting: Neurology

## 2014-11-29 ENCOUNTER — Other Ambulatory Visit: Payer: BC Managed Care – PPO

## 2014-11-29 DIAGNOSIS — B1710 Acute hepatitis C without hepatic coma: Secondary | ICD-10-CM

## 2014-11-29 LAB — CBC WITH DIFFERENTIAL/PLATELET
Basophils Absolute: 0.1 10*3/uL (ref 0.0–0.1)
Basophils Relative: 1 % (ref 0–1)
Eosinophils Absolute: 0.1 10*3/uL (ref 0.0–0.7)
Eosinophils Relative: 2 % (ref 0–5)
HCT: 38.4 % — ABNORMAL LOW (ref 39.0–52.0)
Hemoglobin: 12.6 g/dL — ABNORMAL LOW (ref 13.0–17.0)
Lymphocytes Relative: 28 % (ref 12–46)
Lymphs Abs: 2 10*3/uL (ref 0.7–4.0)
MCH: 31.7 pg (ref 26.0–34.0)
MCHC: 32.8 g/dL (ref 30.0–36.0)
MCV: 96.5 fL (ref 78.0–100.0)
MPV: 9.5 fL (ref 8.6–12.4)
Monocytes Absolute: 0.8 10*3/uL (ref 0.1–1.0)
Monocytes Relative: 11 % (ref 3–12)
Neutro Abs: 4.1 10*3/uL (ref 1.7–7.7)
Neutrophils Relative %: 58 % (ref 43–77)
Platelets: 256 10*3/uL (ref 150–400)
RBC: 3.98 MIL/uL — ABNORMAL LOW (ref 4.22–5.81)
RDW: 13.9 % (ref 11.5–15.5)
WBC: 7.1 10*3/uL (ref 4.0–10.5)

## 2014-11-29 LAB — COMPREHENSIVE METABOLIC PANEL
ALT: 12 U/L (ref 0–53)
AST: 9 U/L (ref 0–37)
Albumin: 4 g/dL (ref 3.5–5.2)
Alkaline Phosphatase: 44 U/L (ref 39–117)
BUN: 16 mg/dL (ref 6–23)
CO2: 29 mEq/L (ref 19–32)
Calcium: 8.9 mg/dL (ref 8.4–10.5)
Chloride: 101 mEq/L (ref 96–112)
Creat: 0.9 mg/dL (ref 0.50–1.35)
Glucose, Bld: 77 mg/dL (ref 70–99)
Potassium: 4.1 mEq/L (ref 3.5–5.3)
Sodium: 138 mEq/L (ref 135–145)
Total Bilirubin: 2 mg/dL — ABNORMAL HIGH (ref 0.2–1.2)
Total Protein: 6.5 g/dL (ref 6.0–8.3)

## 2014-12-01 LAB — HEPATITIS C RNA QUANTITATIVE: HCV Quantitative: NOT DETECTED IU/mL (ref <15)

## 2014-12-06 ENCOUNTER — Ambulatory Visit (INDEPENDENT_AMBULATORY_CARE_PROVIDER_SITE_OTHER): Payer: BLUE CROSS/BLUE SHIELD | Admitting: Internal Medicine

## 2014-12-06 VITALS — BP 133/62 | HR 82 | Wt 292.0 lb

## 2014-12-06 DIAGNOSIS — B182 Chronic viral hepatitis C: Secondary | ICD-10-CM | POA: Diagnosis not present

## 2014-12-06 NOTE — Assessment & Plan Note (Addendum)
Doing well on treatment.  Will try to get to 16 weeks if approved.   No dose adjustment for ribavirin indicated at this time.  CBC in 3 weeks and follow up with me in 4 weeks.

## 2014-12-06 NOTE — Progress Notes (Signed)
   Subjective:    Patient ID: [name], male    DOB: November 28, 1955, 59 y.o.   MRN: [id number]  HPI Here for follow up of HCV.  Genotype 2, viral load 35,835.  Seen by my partner after his initial visit with joint complaints and referred to rheumatology.  Noted significantly elevated CK and ALT/AST.  Started on Prednisone, now nearly weaned off.  No etiology identified. Also had carpal tunnel surgery.  Approved for treatment with Sovaldi and ribavirin and now completed about 6 weeks.  Initial Hgb of 16, down to 13 and 12.6 on ribavirin.  No new complaints.  Has been approved for 12 weeks, hopefully will get extension to 16 weeks.    Review of Systems  Constitutional: Positive for fatigue.  Gastrointestinal: Negative for diarrhea.  Musculoskeletal: Negative for myalgias.  Skin: Negative for rash.  Neurological: Negative for dizziness and light-headedness.       Objective:   Physical Exam  Constitutional: He appears well-developed and well-nourished. No distress.  Eyes: No scleral icterus.  Cardiovascular: Normal rate, regular rhythm and normal heart sounds.   No murmur heard. Pulmonary/Chest: Effort normal and breath sounds normal. No respiratory distress.  Skin: No rash noted.          Assessment & Plan:

## 2014-12-07 ENCOUNTER — Encounter: Payer: Self-pay | Admitting: Pharmacist Clinician (PhC)/ Clinical Pharmacy Specialist

## 2014-12-07 NOTE — Progress Notes (Signed)
Patient ID: [name], male   DOB: 05-12-1956, 59 y.o.   MRN: [id number] HPI: [name] is a 59 y.o. male who was recently seen for hep C.   Lab Results  Component Value Date   HCVGENOTYPE 2 07/02/2014    Allergies: No Known Allergies  Vitals:    Past Medical History: Past Medical History  Diagnosis Date  . Arthritis   . Hepatitis C   . Personal history of adenomatous colonic polyps 08/05/2012    8 mm tubulovillous adenoma 2010 08/05/2012 no polyps - next routine exam 2018    . Pneumonia age 59    hx of  . Hiatal hernia     Social History: History   Social History  . Marital Status: Married    Spouse Name: N/A    Number of Children: 2  . Years of Education: N/A   Occupational History  . Sales    Social History Main Topics  . Smoking status: Never Smoker   . Smokeless tobacco: Never Used  . Alcohol Use: No  . Drug Use: No  . Sexual Activity:    Partners: Female   Other Topics Concern  . Not on file   Social History Narrative   married, 2 sons and 2 daughters employed in [employer]. One caffeinated beverage daily    Labs: HEP B S AB (no units)  Date Value  07/02/2014 NEG   HEPATITIS B SURFACE AG (no units)  Date Value  07/02/2014 NEGATIVE    Lab Results  Component Value Date   HCVGENOTYPE 2 07/02/2014    Hepatitis C RNA quantitative Latest Ref Rng 11/29/2014 07/02/2014  HCV Quantitative <15 IU/mL Not Detected 35835(H)  HCV Quantitative Log <1.18 log 10 NOT CALC 4.55(H)    AST (U/L)  Date Value  11/29/2014 9  11/10/2014 11  09/07/2014 22   ALT (U/L)  Date Value  11/29/2014 12  11/10/2014 17  09/07/2014 54*   INR  Date Value  07/23/2014 1.0 ratio  10/29/2013 0.95    CrCl: Estimated Creatinine Clearance: 136.2 mL/min (by C-G formula based on Cr of 0.9).  Fibrosis Score: F4 as assessed by [facility]  Child-Pugh Score: Class B  Previous Treatment Regimen: Interferon/riba  Assessment: 59 yo who started treatment around 10/26/14 with  Sovaldi/riba. His most recent VL came back undetectable. He was only approved for 12 wks even though he has cirrhosis and "failed" previous treatment. I submitted 2 appeals previously and they were denied. He does need 16 wks due to his condition. Talked to [employer] again today and they want us to submit another PA for the 4 wks extension.   Recommendations:  Appeal faxed 12/07/14  Ulyses SouthwardPham, Minh JessieQuang, VermontPharm.D., BCPS, AAHIVP Clinical Infectious Disease Pharmacist [facility] 12/07/2014, 11:22 AM

## 2014-12-21 ENCOUNTER — Ambulatory Visit: Payer: BC Managed Care – PPO | Admitting: Neurology

## 2014-12-24 ENCOUNTER — Telehealth: Payer: Self-pay | Admitting: *Deleted

## 2014-12-24 ENCOUNTER — Telehealth: Payer: Self-pay | Admitting: Family

## 2014-12-24 DIAGNOSIS — G4700 Insomnia, unspecified: Secondary | ICD-10-CM

## 2014-12-24 MED ORDER — ALPRAZOLAM 0.5 MG PO TABS: 0.5000 mg | ORAL_TABLET | Freq: Every evening | ORAL | Status: DC | PRN

## 2014-12-24 NOTE — Telephone Encounter (Signed)
Patient called stating he has been having difficulty sleeping since his recent carpal tunnel surgery and felt he needed an Rx for Ambien. Advised patient to call his PCP to request this medication. Wendall MolaJacqueline Aeris Hersman

## 2014-12-24 NOTE — Telephone Encounter (Signed)
Would like to see if he could get a refill on xanax.  [facility] uses [facility] [address].

## 2014-12-24 NOTE — Telephone Encounter (Signed)
Pt aware.

## 2014-12-24 NOTE — Telephone Encounter (Signed)
Rx sent 

## 2014-12-27 ENCOUNTER — Other Ambulatory Visit: Payer: BLUE CROSS/BLUE SHIELD

## 2014-12-29 ENCOUNTER — Other Ambulatory Visit: Payer: Self-pay | Admitting: Internal Medicine

## 2014-12-29 ENCOUNTER — Telehealth: Payer: Self-pay | Admitting: Family

## 2014-12-29 NOTE — Telephone Encounter (Signed)
Pt called stated [facility] does not have the ALPRAZolam (XANAX) 0.5 MG . Please check

## 2014-12-29 NOTE — Telephone Encounter (Signed)
Called in pts rx.

## 2014-12-30 ENCOUNTER — Other Ambulatory Visit: Payer: BLUE CROSS/BLUE SHIELD

## 2014-12-30 DIAGNOSIS — B182 Chronic viral hepatitis C: Secondary | ICD-10-CM

## 2014-12-30 LAB — CBC WITH DIFFERENTIAL/PLATELET
Basophils Absolute: 0 10*3/uL (ref 0.0–0.1)
Basophils Relative: 0 % (ref 0–1)
Eosinophils Absolute: 0.1 10*3/uL (ref 0.0–0.7)
Eosinophils Relative: 1 % (ref 0–5)
HCT: 35.8 % — ABNORMAL LOW (ref 39.0–52.0)
Hemoglobin: 12 g/dL — ABNORMAL LOW (ref 13.0–17.0)
Lymphocytes Relative: 21 % (ref 12–46)
Lymphs Abs: 1.4 10*3/uL (ref 0.7–4.0)
MCH: 31.6 pg (ref 26.0–34.0)
MCHC: 33.5 g/dL (ref 30.0–36.0)
MCV: 94.2 fL (ref 78.0–100.0)
MPV: 9.3 fL (ref 8.6–12.4)
Monocytes Absolute: 0.7 10*3/uL (ref 0.1–1.0)
Monocytes Relative: 11 % (ref 3–12)
Neutro Abs: 4.5 10*3/uL (ref 1.7–7.7)
Neutrophils Relative %: 67 % (ref 43–77)
Platelets: 235 10*3/uL (ref 150–400)
RBC: 3.8 MIL/uL — ABNORMAL LOW (ref 4.22–5.81)
RDW: 13.5 % (ref 11.5–15.5)
WBC: 6.7 10*3/uL (ref 4.0–10.5)

## 2015-01-11 ENCOUNTER — Ambulatory Visit: Payer: BLUE CROSS/BLUE SHIELD | Admitting: Internal Medicine

## 2015-01-19 ENCOUNTER — Telehealth: Payer: Self-pay | Admitting: Neurology

## 2015-01-19 NOTE — Telephone Encounter (Signed)
Pt canceled his f/u appt for 01/11/15. Pt stated that he is working again and will have to check his schedule.  Will call later to r/s.

## 2015-01-21 ENCOUNTER — Ambulatory Visit: Payer: BLUE CROSS/BLUE SHIELD | Admitting: Neurology

## 2015-01-27 ENCOUNTER — Ambulatory Visit: Payer: BLUE CROSS/BLUE SHIELD | Admitting: Internal Medicine

## 2015-02-01 ENCOUNTER — Other Ambulatory Visit: Payer: Self-pay | Admitting: *Deleted

## 2015-02-01 ENCOUNTER — Telehealth: Payer: Self-pay | Admitting: *Deleted

## 2015-02-01 DIAGNOSIS — B1710 Acute hepatitis C without hepatic coma: Secondary | ICD-10-CM

## 2015-02-01 NOTE — Telephone Encounter (Signed)
Called the patient and advised her we need a CBC now and he should schedule a follow up near the end of treatment close to the 16 week mark and he advised great he will come in Friday 02/04/15 at 9 am.

## 2015-02-01 NOTE — Telephone Encounter (Signed)
Patient called to advise that he will not be able to make it to his appt 02/02/15 and wants to ask the doctor a couple of questions. He advised that is job has him in meetings and can not make it to office visits for check ups. He advised he is 3 weeks from 3 months of taking his meds and wants to know if he will have to take them longer? He also wants to know if it is necessary for him to be seen and if he needs any further labs. Advised the patient all of these questions could be answered if he comes to his visit but that I will send the doctor a message and give him a call back.

## 2015-02-01 NOTE — Telephone Encounter (Signed)
He was approved for 16 weeks total so should keep going (16 weeks for someone previously treated and cirrhosis).  He needs a cbc now.  thanks

## 2015-02-02 ENCOUNTER — Ambulatory Visit: Payer: BLUE CROSS/BLUE SHIELD | Admitting: Internal Medicine

## 2015-02-04 ENCOUNTER — Other Ambulatory Visit: Payer: BLUE CROSS/BLUE SHIELD

## 2015-02-04 DIAGNOSIS — B1710 Acute hepatitis C without hepatic coma: Secondary | ICD-10-CM

## 2015-02-04 LAB — CBC WITH DIFFERENTIAL/PLATELET
Basophils Absolute: 0.1 10*3/uL (ref 0.0–0.1)
Basophils Relative: 1 % (ref 0–1)
Eosinophils Absolute: 0.1 10*3/uL (ref 0.0–0.7)
Eosinophils Relative: 2 % (ref 0–5)
HCT: 36.5 % — ABNORMAL LOW (ref 39.0–52.0)
Hemoglobin: 12.1 g/dL — ABNORMAL LOW (ref 13.0–17.0)
Lymphocytes Relative: 21 % (ref 12–46)
Lymphs Abs: 1.3 10*3/uL (ref 0.7–4.0)
MCH: 31.4 pg (ref 26.0–34.0)
MCHC: 33.2 g/dL (ref 30.0–36.0)
MCV: 94.8 fL (ref 78.0–100.0)
MPV: 9.4 fL (ref 8.6–12.4)
Monocytes Absolute: 0.5 10*3/uL (ref 0.1–1.0)
Monocytes Relative: 9 % (ref 3–12)
Neutro Abs: 4 10*3/uL (ref 1.7–7.7)
Neutrophils Relative %: 67 % (ref 43–77)
Platelets: 237 10*3/uL (ref 150–400)
RBC: 3.85 MIL/uL — ABNORMAL LOW (ref 4.22–5.81)
RDW: 14.1 % (ref 11.5–15.5)
WBC: 6 10*3/uL (ref 4.0–10.5)

## 2015-02-14 ENCOUNTER — Encounter: Payer: Self-pay | Admitting: Internal Medicine

## 2015-02-14 ENCOUNTER — Ambulatory Visit (INDEPENDENT_AMBULATORY_CARE_PROVIDER_SITE_OTHER): Payer: BLUE CROSS/BLUE SHIELD | Admitting: Internal Medicine

## 2015-02-14 VITALS — BP 142/87 | HR 80 | Temp 97.9°F | Ht 76.0 in | Wt 282.0 lb

## 2015-02-14 DIAGNOSIS — K7460 Unspecified cirrhosis of liver: Secondary | ICD-10-CM

## 2015-02-14 DIAGNOSIS — B182 Chronic viral hepatitis C: Secondary | ICD-10-CM | POA: Diagnosis not present

## 2015-02-14 LAB — COMPLETE METABOLIC PANEL WITH GFR
ALT: 13 U/L (ref 0–53)
AST: 10 U/L (ref 0–37)
Albumin: 4.1 g/dL (ref 3.5–5.2)
Alkaline Phosphatase: 49 U/L (ref 39–117)
BUN: 15 mg/dL (ref 6–23)
CO2: 27 mEq/L (ref 19–32)
Calcium: 8.9 mg/dL (ref 8.4–10.5)
Chloride: 102 mEq/L (ref 96–112)
Creat: 0.96 mg/dL (ref 0.50–1.35)
GFR, Est African American: >89 mL/min
GFR, Est Non African American: 87 mL/min
Glucose, Bld: 84 mg/dL (ref 70–99)
Potassium: 3.9 mEq/L (ref 3.5–5.3)
Sodium: 137 mEq/L (ref 135–145)
Total Bilirubin: 2.4 mg/dL — ABNORMAL HIGH (ref 0.2–1.2)
Total Protein: 6.4 g/dL (ref 6.0–8.3)

## 2015-02-14 LAB — CBC WITH DIFFERENTIAL/PLATELET
Basophils Absolute: 0 10*3/uL (ref 0.0–0.1)
Basophils Relative: 0 % (ref 0–1)
Eosinophils Absolute: 0.1 10*3/uL (ref 0.0–0.7)
Eosinophils Relative: 2 % (ref 0–5)
HCT: 36 % — ABNORMAL LOW (ref 39.0–52.0)
Hemoglobin: 12 g/dL — ABNORMAL LOW (ref 13.0–17.0)
Lymphocytes Relative: 23 % (ref 12–46)
Lymphs Abs: 1.4 10*3/uL (ref 0.7–4.0)
MCH: 31.7 pg (ref 26.0–34.0)
MCHC: 33.3 g/dL (ref 30.0–36.0)
MCV: 95 fL (ref 78.0–100.0)
MPV: 9.5 fL (ref 8.6–12.4)
Monocytes Absolute: 0.5 10*3/uL (ref 0.1–1.0)
Monocytes Relative: 9 % (ref 3–12)
Neutro Abs: 4 10*3/uL (ref 1.7–7.7)
Neutrophils Relative %: 66 % (ref 43–77)
Platelets: 256 10*3/uL (ref 150–400)
RBC: 3.79 MIL/uL — ABNORMAL LOW (ref 4.22–5.81)
RDW: 13.8 % (ref 11.5–15.5)
WBC: 6 10*3/uL (ref 4.0–10.5)

## 2015-02-14 NOTE — Assessment & Plan Note (Signed)
He will get end of treatment viral load today. I will then check this in 3 months and 6 months.

## 2015-02-14 NOTE — Progress Notes (Signed)
   Subjective:    Patient ID: [name], male    DOB: July 03, 1956, 59 y.o.   MRN: [id number]  HPI Here for follow up of HCV.  Genotype 2, viral load 35,835.  On treatment with Sovaldi and ribavirin and now completed about 16 weeks out of 16.  Initial Hgb of 16, down to 13 and 12.6 on ribavirin.  No new complaints.  Pleased to almost be done with treatment.    Review of Systems  Constitutional: Positive for fatigue.  Gastrointestinal: Negative for diarrhea.  Musculoskeletal: Negative for myalgias.  Skin: Negative for rash.  Neurological: Negative for dizziness and light-headedness.       Objective:   Physical Exam  Constitutional: He appears well-developed and well-nourished. No distress.  Eyes: No scleral icterus.  Cardiovascular: Normal rate, regular rhythm and normal heart sounds.   No murmur heard. Pulmonary/Chest: Effort normal and breath sounds normal. No respiratory distress.  Skin: No rash noted.          Assessment & Plan:

## 2015-02-14 NOTE — Assessment & Plan Note (Signed)
He will get screening ultrasound [facility]

## 2015-02-15 ENCOUNTER — Ambulatory Visit (INDEPENDENT_AMBULATORY_CARE_PROVIDER_SITE_OTHER): Payer: BLUE CROSS/BLUE SHIELD | Admitting: Family

## 2015-02-15 ENCOUNTER — Encounter: Payer: Self-pay | Admitting: Family

## 2015-02-15 VITALS — BP 138/82 | HR 84 | Temp 98.1°F | Resp 18 | Wt 278.8 lb

## 2015-02-15 DIAGNOSIS — G4700 Insomnia, unspecified: Secondary | ICD-10-CM

## 2015-02-15 DIAGNOSIS — B182 Chronic viral hepatitis C: Secondary | ICD-10-CM

## 2015-02-15 MED ORDER — ALPRAZOLAM 1 MG PO TABS: 1.0000 mg | ORAL_TABLET | Freq: Two times a day (BID) | ORAL | Status: DC | PRN

## 2015-02-15 NOTE — Assessment & Plan Note (Signed)
Patient has recently completed hepatitis C treatment. Indicates that he is feeling significantly improved. He has follow-ups with infectious disease and neurology. We will continue to monitor as needed and recommended by infectious disease.

## 2015-02-15 NOTE — Progress Notes (Signed)
Pre visit review using our clinic review tool, if applicable. No additional management support is needed unless otherwise documented below in the visit note. 

## 2015-02-15 NOTE — Patient Instructions (Addendum)
Thank you for choosing [facility].  Summary/Instructions:  Schedule a time for your physical.  Your prescription(s) have been submitted to your pharmacy or been printed and provided for you. Please take as directed and contact our office if you believe you are having problem(s) with the medication(s) or have any questions.  If your symptoms worsen or fail to improve, please contact our office for further instruction, or in case of emergency go directly to the emergency room at the closest medical facility.    Recommendations for improving sleep:   Avoid having pets sleep in the bedroom  Avoid caffeine consumption after 4pm  Keep bedroom cool and conducive to sleep  Avoid nicotine use, especially in the evening  Avoid exercise within 2-3 hours before bedtime  Stimulus Control:   Go to bed only when sleepy  Use the bedroom for sleep and sex only  Go to another room if you are unable to fall asleep within 15 to 20 minutes  Read or engage in other quiet activities and return to bed only when sleepy.

## 2015-02-15 NOTE — Progress Notes (Signed)
   Subjective:    Patient ID: [name], male    DOB: 1956-10-20, 59 y.o.   MRN: [id number]  Chief Complaint  Patient presents with  . Follow-up    wants to talk about his approach with whats going to happen in the future with his liver    HPI:  [name] is a 59 y.o. male who presents today to follow up.  1) Hepatitis C - Has completed treatment for the Hepatitis C. Notes that he now is wearing a hearing aid and the does have some small nerve damage in his feet. Indicates that he has followed up with both infectious disease and neurology. Continues to do rehabilitation independently. Also has completeted the ulnar nerve surgery on the right upper extremity. Reports feeling better and doing better with improved energy and decreased sweating. Has been able to return to work.   2) Insomnia - Stable with current treatment of Xanax.   No Known Allergies   Current Outpatient Prescriptions on File Prior to Visit  Medication Sig Dispense Refill  . ALPRAZolam (XANAX) 0.5 MG tablet TAKE 1 OR 2 TABLETS (0.5-1MG  TOTAL) BY MOUTH AT BEDTIME AS NEEDED FOR ANXIETY OR SLEEP 60 tablet 0  . ribavirin (REBETOL) 200 MG capsule Take 3 capsules (600 mg total) by mouth 2 (two) times daily. 168 capsule 5  . Sofosbuvir 400 MG TABS Take 1 tablet by mouth daily. 28 tablet 5  . VYVANSE 60 MG capsule      No current facility-administered medications on file prior to visit.    Review of Systems  Constitutional: Negative for fever and chills.  Gastrointestinal: Negative for abdominal pain.      Objective:    BP 138/82 mmHg  Pulse 84  Temp(Src) 98.1 F (36.7 C) (Oral)  Resp 18  Wt 278 lb 12.8 oz (126.463 kg)  SpO2 98% Nursing note and vital signs reviewed.  Physical Exam  Constitutional: He is oriented to person, place, and time. He appears well-developed and well-nourished. No distress.  Cardiovascular: Normal rate, regular rhythm, normal heart sounds and intact distal pulses.     Pulmonary/Chest: Effort normal and breath sounds normal.  Neurological: He is alert and oriented to person, place, and time.  Skin: Skin is warm and dry.  Psychiatric: He has a normal mood and affect. His behavior is normal. Judgment and thought content normal.       Assessment & Plan:

## 2015-02-15 NOTE — Assessment & Plan Note (Signed)
Indicates he continues to experience some insomnia, however the Xanax is helping him sleep. Provided sleep hygiene material to patient. Indicates he is taking 1 mg to help him sleep at night. Increase Xanax to 1 mg. Follow-up if symptoms worsen or fail to improve.

## 2015-02-17 ENCOUNTER — Telehealth: Payer: Self-pay | Admitting: *Deleted

## 2015-02-17 LAB — HEPATITIS C RNA QUANTITATIVE: HCV Quantitative: NOT DETECTED IU/mL (ref <15)

## 2015-02-17 NOTE — Telephone Encounter (Signed)
Relayed results. Howell, Michelle M, RN  

## 2015-02-17 NOTE — Telephone Encounter (Signed)
-----   Message from Gardiner Barefootobert W Comer, MD sent at 02/17/2015  4:57 PM EDT ----- Please let him know his HCV has remained undetectable. thanks

## 2015-03-16 ENCOUNTER — Other Ambulatory Visit: Payer: Self-pay

## 2015-03-16 ENCOUNTER — Other Ambulatory Visit: Payer: Self-pay | Admitting: Family

## 2015-03-16 MED ORDER — ALPRAZOLAM 1 MG PO TABS: 1.0000 mg | ORAL_TABLET | Freq: Two times a day (BID) | ORAL | Status: DC | PRN

## 2015-03-23 ENCOUNTER — Encounter: Payer: Self-pay | Admitting: Internal Medicine

## 2015-04-13 ENCOUNTER — Other Ambulatory Visit: Payer: Self-pay | Admitting: Family

## 2015-05-13 ENCOUNTER — Other Ambulatory Visit: Payer: BLUE CROSS/BLUE SHIELD

## 2015-05-13 DIAGNOSIS — B182 Chronic viral hepatitis C: Secondary | ICD-10-CM

## 2015-05-17 ENCOUNTER — Other Ambulatory Visit: Payer: BLUE CROSS/BLUE SHIELD

## 2015-05-17 LAB — HEPATITIS C RNA QUANTITATIVE: HCV Quantitative: NOT DETECTED IU/mL (ref <15)

## 2015-05-19 ENCOUNTER — Other Ambulatory Visit: Payer: Self-pay

## 2015-05-19 ENCOUNTER — Telehealth: Payer: Self-pay

## 2015-05-19 MED ORDER — ALPRAZOLAM 1 MG PO TABS: 1.0000 mg | ORAL_TABLET | Freq: Two times a day (BID) | ORAL | Status: DC | PRN

## 2015-05-19 NOTE — Telephone Encounter (Signed)
Medication refilled

## 2015-05-19 NOTE — Telephone Encounter (Signed)
Refill has been faxed to pharmacy. Pt aware

## 2015-05-19 NOTE — Telephone Encounter (Signed)
Pt requesting refill of alprazolam 1 mg. Please advise.

## 2015-05-20 ENCOUNTER — Ambulatory Visit
Admission: RE | Admit: 2015-05-20 | Discharge: 2015-05-20 | Disposition: A | Discharge status: Home / Self Care | Payer: BLUE CROSS/BLUE SHIELD | Source: Ambulatory Visit | Attending: Internal Medicine | Admitting: Internal Medicine

## 2015-05-20 DIAGNOSIS — B182 Chronic viral hepatitis C: Secondary | ICD-10-CM

## 2015-05-20 DIAGNOSIS — K7460 Unspecified cirrhosis of liver: Principal | ICD-10-CM

## 2015-05-25 ENCOUNTER — Telehealth: Payer: Self-pay | Admitting: Licensed Clinical Social Worker

## 2015-05-25 NOTE — Telephone Encounter (Signed)
Patient called wanting results of his ultrasound, results given. Patient wanted me to let Dr. Luciana Axeomer know that he has started having tenderness on his right side where his liver is and feels that something is wrong. He has this feeling at night mainly, please advise.

## 2015-05-26 NOTE — Telephone Encounter (Signed)
Patient states that he read that tenderness could come from inflammation from the lining of your liver, and he was wondering if that could be what he is feeling, otherwise he states that he will call his PCP if it worsens

## 2015-05-26 NOTE — Telephone Encounter (Signed)
Could be his gallbladder.  Would not be related to liver or hep C since that does not cause pain.  Should check with his PCP, may need evaluation.

## 2015-07-01 ENCOUNTER — Other Ambulatory Visit: Payer: Self-pay | Admitting: Family

## 2015-07-01 NOTE — Telephone Encounter (Signed)
Please advise, thanks.

## 2015-08-11 ENCOUNTER — Other Ambulatory Visit: Payer: Self-pay | Admitting: Family

## 2015-08-11 NOTE — Telephone Encounter (Signed)
Last refill 8/5

## 2015-09-14 ENCOUNTER — Other Ambulatory Visit: Payer: Self-pay | Admitting: Family

## 2015-09-15 NOTE — Telephone Encounter (Signed)
Last refill was 9/15

## 2015-10-18 IMAGING — CR DG HIP COMPLETE 2+V*R*
3 series · 3 of 3 positions shown · non-contrast
Comparison: None

CLINICAL DATA: Osteoarthritis in the right hip. Preop for total hip
replacement on 11/06/2013.

EXAM:
RIGHT HIP - COMPLETE 2+ VIEW

[t pelvis a.p.]
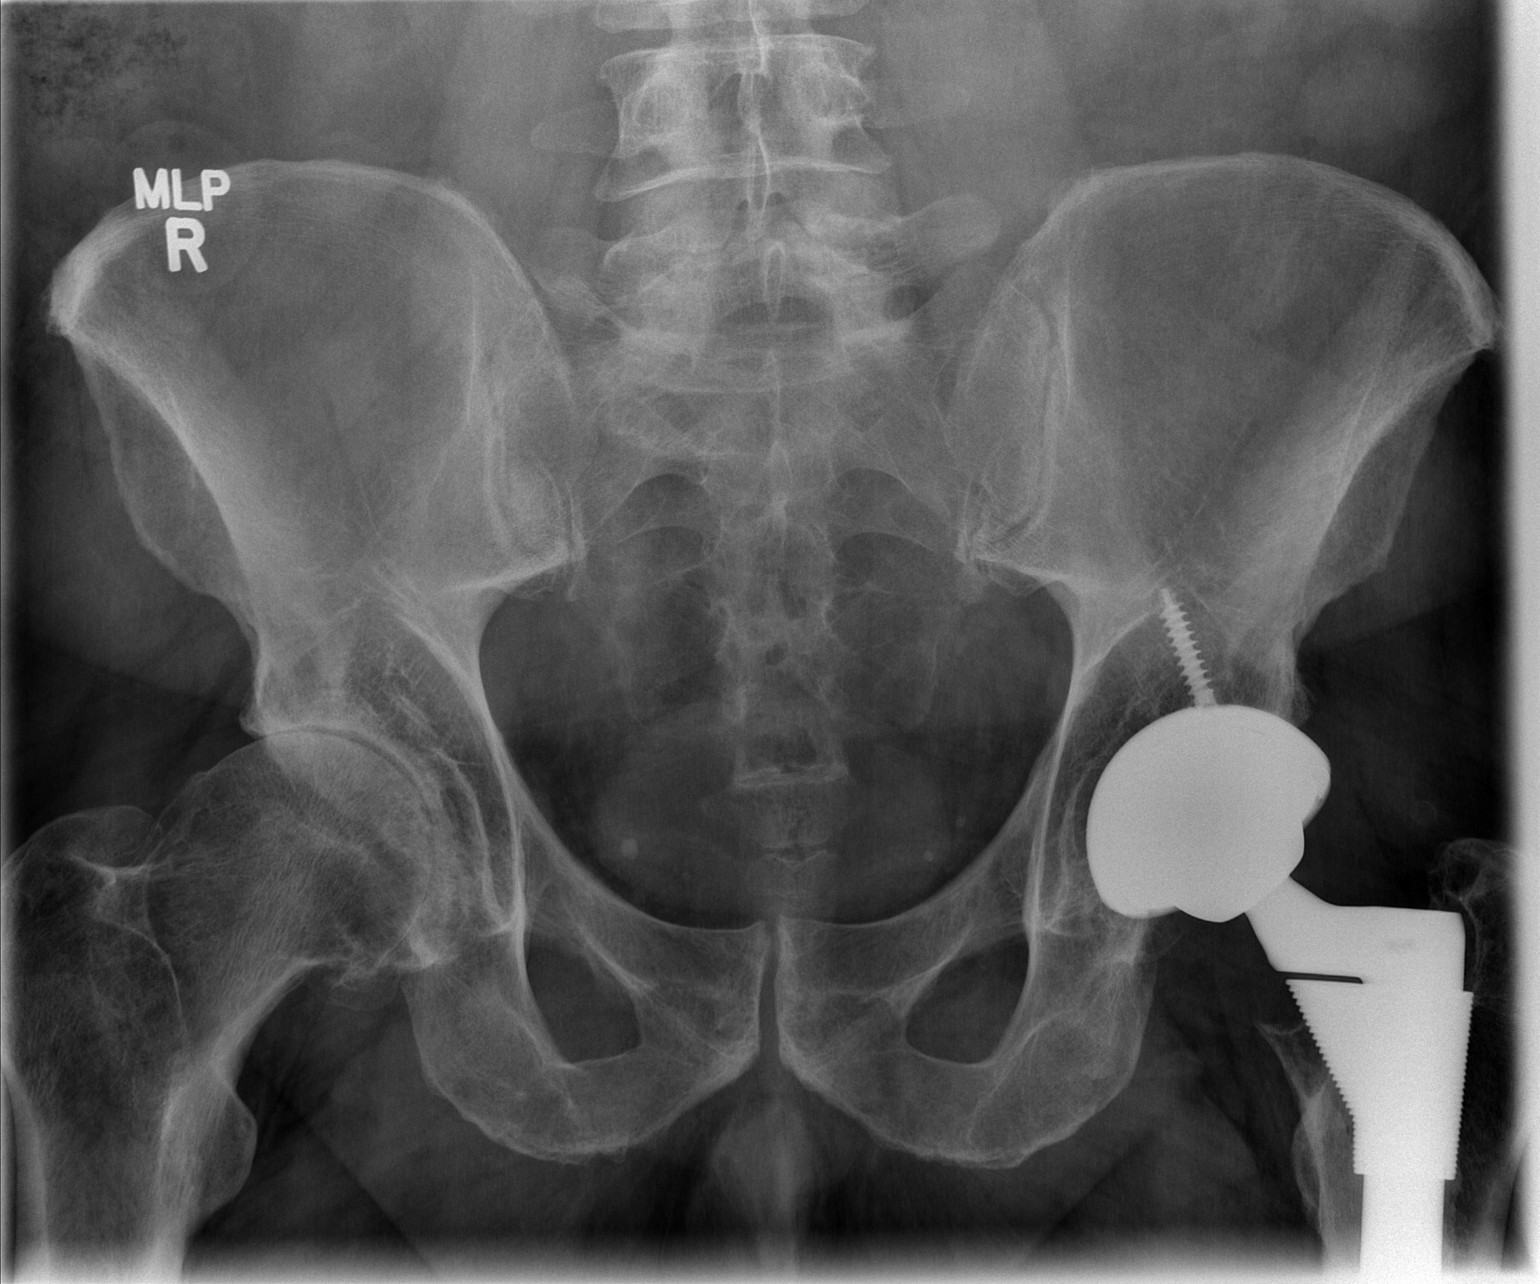

[t hip ap right]
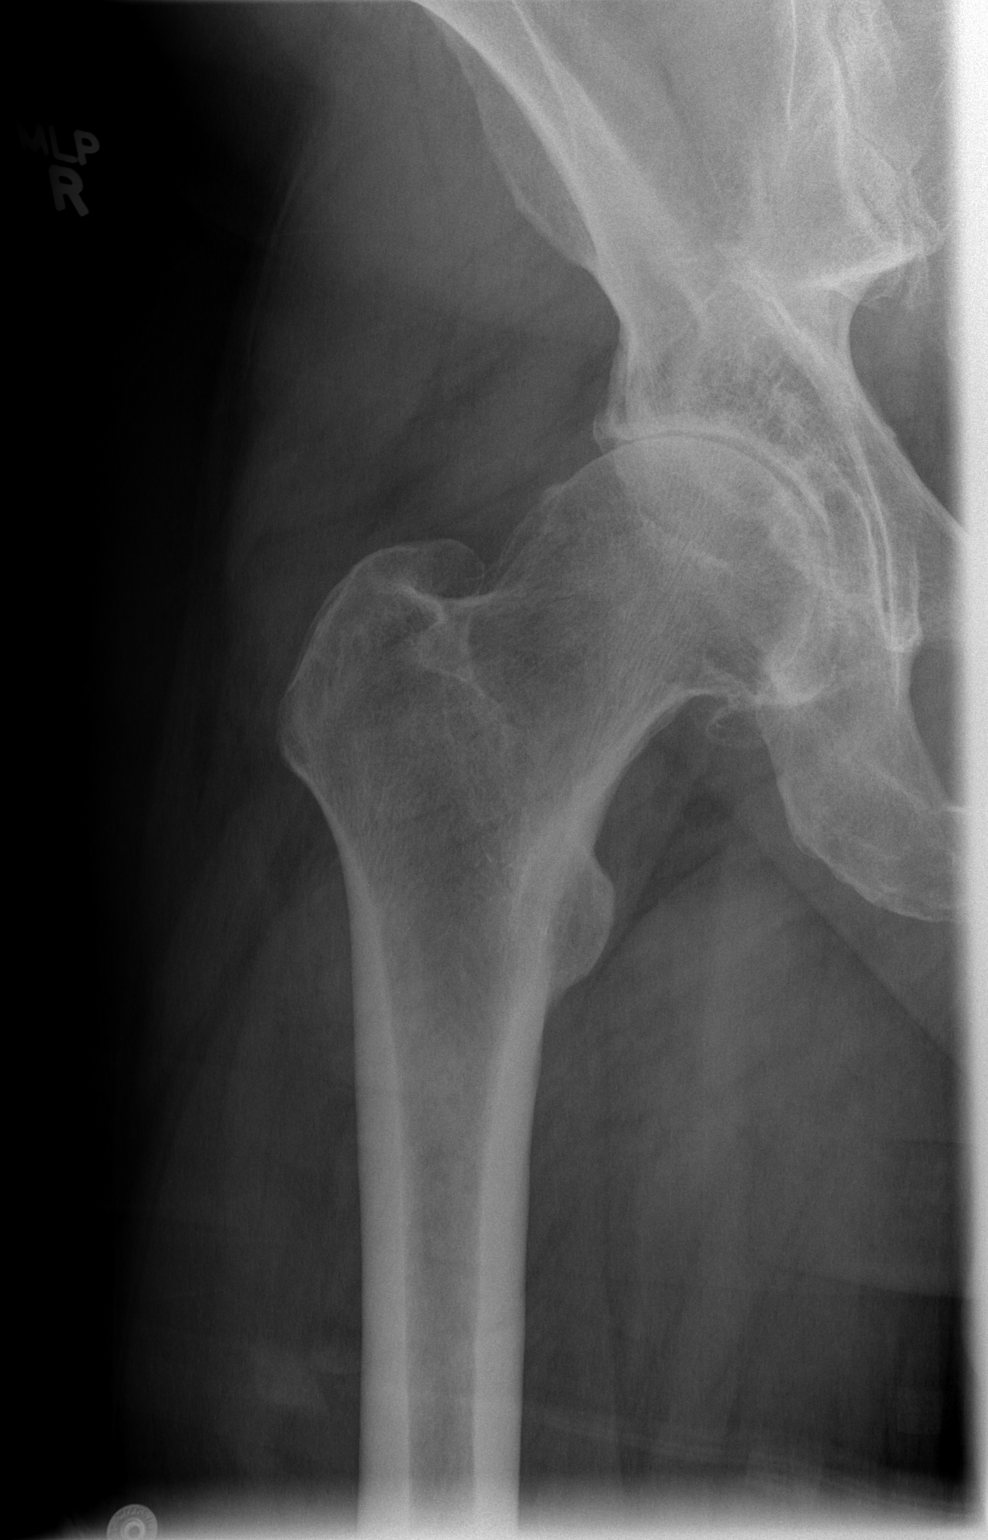

[t hip frog leg right]
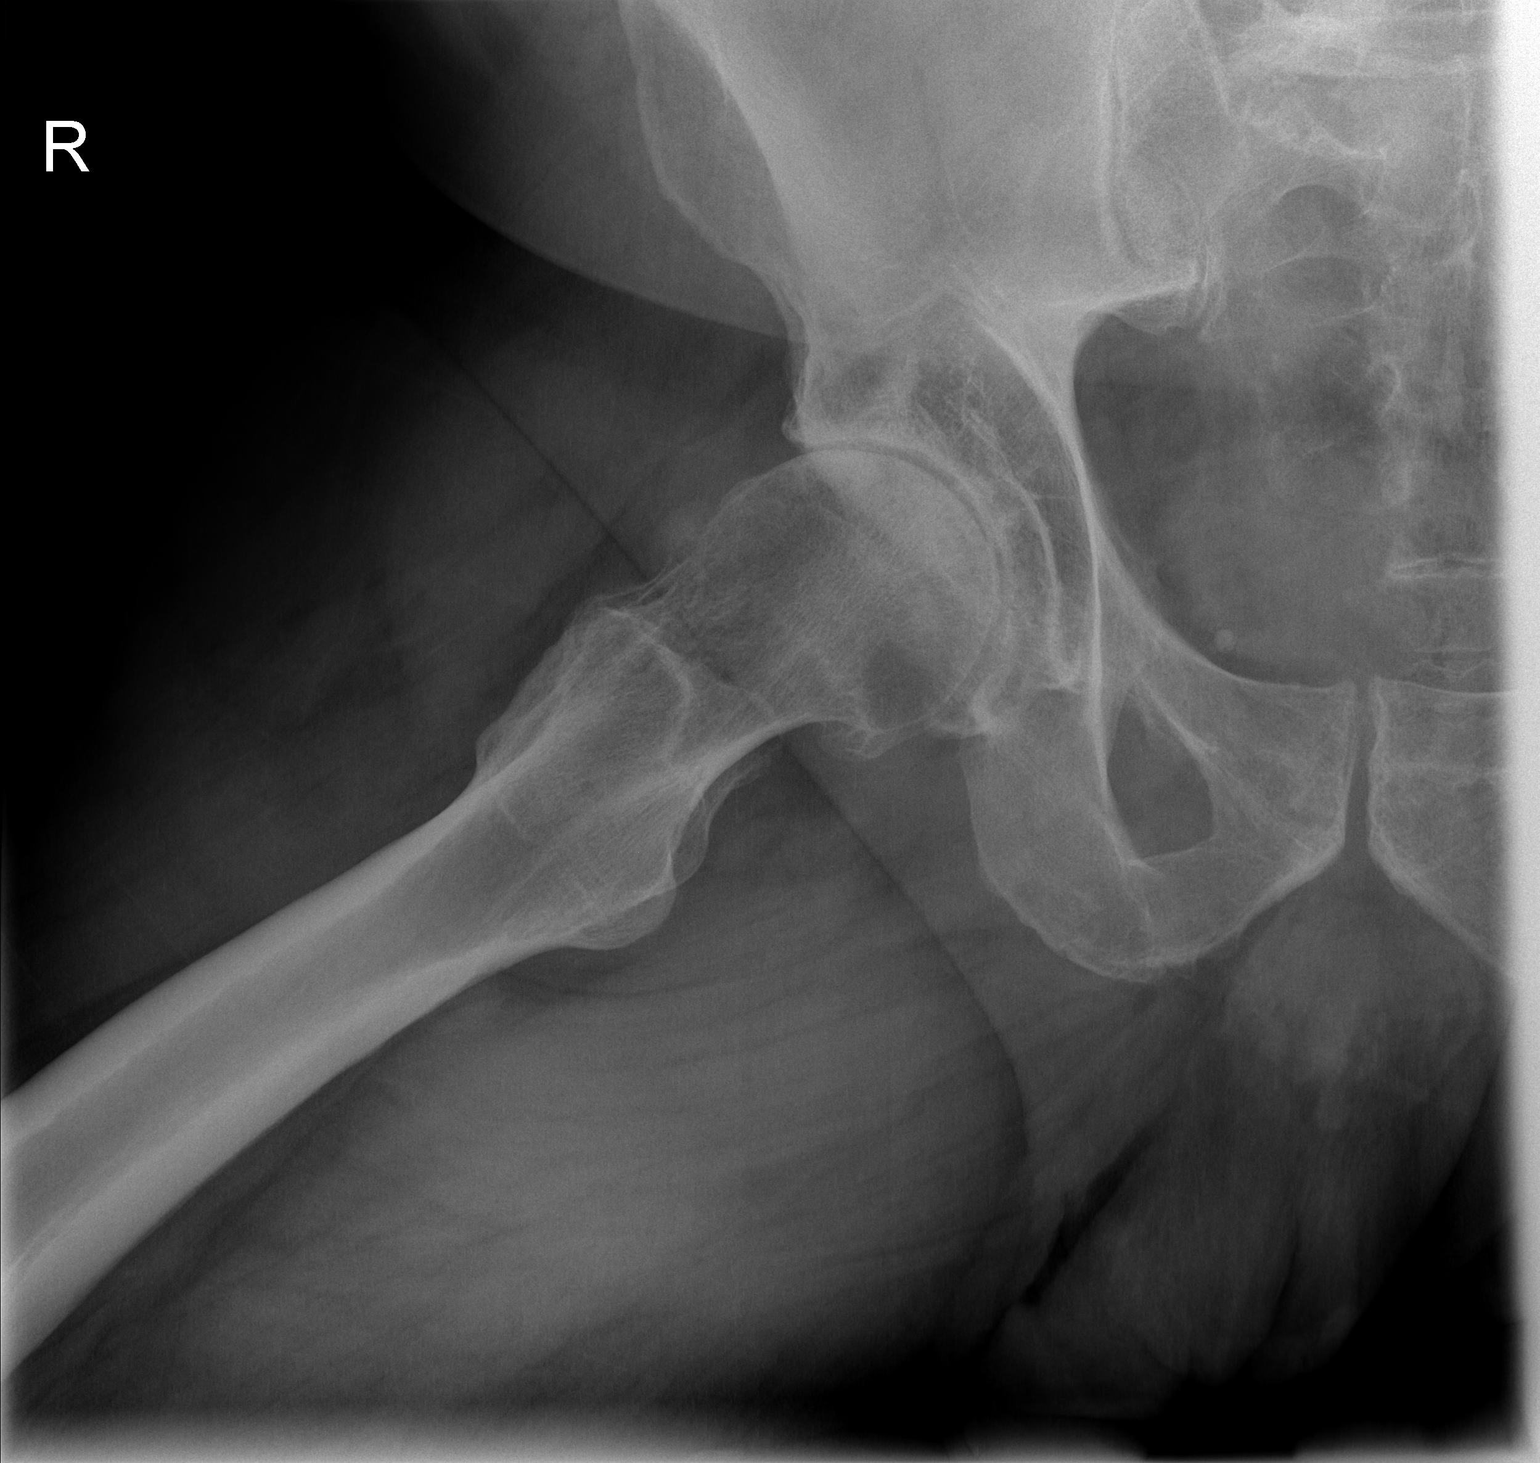

[3 of 3 positions shown; findings below may reference images not displayed]

FINDINGS: The patient has had previous left hip total arthroplasty. There is
significant degenerative change involving the right hip. There is
significant joint space narrowing, osteophyte formation. Geodes are
present. No evidence for acute fracture. No suspicious lytic or
blastic lesions identified.
IMPRESSION: 1. Postoperative changes in the left hip.
2. Significant degenerative changes of the right hip.

## 2015-10-21 ENCOUNTER — Other Ambulatory Visit: Payer: Self-pay | Admitting: Family

## 2015-10-24 ENCOUNTER — Other Ambulatory Visit: Payer: Self-pay | Admitting: Family

## 2015-10-24 NOTE — Telephone Encounter (Signed)
Last refill was 10/20

## 2015-11-25 ENCOUNTER — Other Ambulatory Visit: Payer: Self-pay | Admitting: Family

## 2015-11-29 NOTE — Telephone Encounter (Signed)
Last refill was 11/28

## 2015-12-28 ENCOUNTER — Other Ambulatory Visit: Payer: Self-pay | Admitting: Family

## 2015-12-28 NOTE — Telephone Encounter (Signed)
Faxed back to [facility]...Raechel Chute

## 2016-01-25 ENCOUNTER — Other Ambulatory Visit: Payer: Self-pay | Admitting: Family

## 2016-01-25 NOTE — Telephone Encounter (Signed)
Please advise, thanks.

## 2016-02-21 ENCOUNTER — Other Ambulatory Visit: Payer: Self-pay | Admitting: Family

## 2016-02-21 NOTE — Telephone Encounter (Signed)
Faxed script back to [facility].../lmb 

## 2016-03-16 ENCOUNTER — Other Ambulatory Visit: Payer: Self-pay | Admitting: Family

## 2016-03-16 NOTE — Telephone Encounter (Signed)
Last refill was 02/21/16 

## 2016-04-24 ENCOUNTER — Other Ambulatory Visit: Payer: Self-pay

## 2016-04-24 MED ORDER — ALPRAZOLAM 1 MG PO TABS: ORAL_TABLET | ORAL | Status: DC

## 2016-04-24 NOTE — Telephone Encounter (Deleted)
Pt requesting refill to be sent [facility]. [facility]...Raechel Chute/lmb

## 2016-04-24 NOTE — Telephone Encounter (Signed)
Pt requesting refill of xanax. Last refill was 03/16/16

## 2016-04-24 NOTE — Telephone Encounter (Signed)
Rx has been faxed to [facility]...Raechel Chute/lmb

## 2016-05-29 ENCOUNTER — Other Ambulatory Visit: Payer: Self-pay | Admitting: Family

## 2016-05-30 DIAGNOSIS — F909 Attention-deficit hyperactivity disorder, unspecified type: Secondary | ICD-10-CM | POA: Diagnosis not present

## 2016-05-30 NOTE — Telephone Encounter (Signed)
Tried calling pt to make an appointment. LVM for him to call back.

## 2016-05-30 NOTE — Telephone Encounter (Signed)
Will decline as last fill in May rx states need OV for further refills and no visits were scheduled and patient has not been in >1 year. Needs acute visit.

## 2016-09-05 DIAGNOSIS — F909 Attention-deficit hyperactivity disorder, unspecified type: Secondary | ICD-10-CM | POA: Diagnosis not present

## 2016-09-12 ENCOUNTER — Ambulatory Visit: Payer: BLUE CROSS/BLUE SHIELD | Admitting: Family

## 2016-10-17 DIAGNOSIS — F909 Attention-deficit hyperactivity disorder, unspecified type: Secondary | ICD-10-CM | POA: Diagnosis not present

## 2016-11-28 DIAGNOSIS — F909 Attention-deficit hyperactivity disorder, unspecified type: Secondary | ICD-10-CM | POA: Diagnosis not present

## 2017-01-21 DIAGNOSIS — F909 Attention-deficit hyperactivity disorder, unspecified type: Secondary | ICD-10-CM | POA: Diagnosis not present

## 2017-02-12 ENCOUNTER — Other Ambulatory Visit (INDEPENDENT_AMBULATORY_CARE_PROVIDER_SITE_OTHER): Payer: BLUE CROSS/BLUE SHIELD

## 2017-02-12 ENCOUNTER — Ambulatory Visit (INDEPENDENT_AMBULATORY_CARE_PROVIDER_SITE_OTHER): Payer: BLUE CROSS/BLUE SHIELD | Admitting: Family

## 2017-02-12 ENCOUNTER — Encounter: Payer: Self-pay | Admitting: Family

## 2017-02-12 VITALS — BP 144/86 | HR 82 | Temp 98.3°F | Resp 16 | Ht 76.0 in | Wt 340.0 lb

## 2017-02-12 DIAGNOSIS — E6609 Other obesity due to excess calories: Secondary | ICD-10-CM | POA: Diagnosis not present

## 2017-02-12 DIAGNOSIS — Z6841 Body Mass Index (BMI) 40.0 and over, adult: Secondary | ICD-10-CM

## 2017-02-12 DIAGNOSIS — IMO0001 Reserved for inherently not codable concepts without codable children: Secondary | ICD-10-CM

## 2017-02-12 DIAGNOSIS — E669 Obesity, unspecified: Secondary | ICD-10-CM | POA: Insufficient documentation

## 2017-02-12 DIAGNOSIS — B182 Chronic viral hepatitis C: Secondary | ICD-10-CM | POA: Diagnosis not present

## 2017-02-12 LAB — CBC WITH DIFFERENTIAL/PLATELET
Basophils Absolute: 0.1 10*3/uL (ref 0.0–0.1)
Basophils Relative: 1.1 % (ref 0.0–3.0)
Eosinophils Absolute: 0.2 10*3/uL (ref 0.0–0.7)
Eosinophils Relative: 3.3 % (ref 0.0–5.0)
HCT: 43.4 % (ref 39.0–52.0)
Hemoglobin: 15.1 g/dL (ref 13.0–17.0)
Lymphocytes Relative: 23.1 % (ref 12.0–46.0)
Lymphs Abs: 1.7 10*3/uL (ref 0.7–4.0)
MCHC: 34.8 g/dL (ref 30.0–36.0)
MCV: 93 fl (ref 78.0–100.0)
Monocytes Absolute: 0.7 10*3/uL (ref 0.1–1.0)
Monocytes Relative: 10 % (ref 3.0–12.0)
Neutro Abs: 4.5 10*3/uL (ref 1.4–7.7)
Neutrophils Relative %: 62.5 % (ref 43.0–77.0)
Platelets: 244 10*3/uL (ref 150.0–400.0)
RBC: 4.66 Mil/uL (ref 4.22–5.81)
RDW: 13.1 % (ref 11.5–15.5)
WBC: 7.2 10*3/uL (ref 4.0–10.5)

## 2017-02-12 LAB — BASIC METABOLIC PANEL
BUN: 19 mg/dL (ref 6–23)
CO2: 26 mEq/L (ref 19–32)
Calcium: 9.3 mg/dL (ref 8.4–10.5)
Chloride: 107 mEq/L (ref 96–112)
Creatinine, Ser: 0.91 mg/dL (ref 0.40–1.50)
GFR: 90.61 mL/min (ref >60.00)
Glucose, Bld: 107 mg/dL — ABNORMAL HIGH (ref 70–99)
Potassium: 4 mEq/L (ref 3.5–5.1)
Sodium: 140 mEq/L (ref 135–145)

## 2017-02-12 LAB — HEPATIC FUNCTION PANEL
ALT: 17 U/L (ref 0–53)
AST: 8 U/L (ref 0–37)
Albumin: 4.3 g/dL (ref 3.5–5.2)
Alkaline Phosphatase: 45 U/L (ref 39–117)
Bilirubin, Direct: 0.1 mg/dL (ref 0.0–0.3)
Total Bilirubin: 0.9 mg/dL (ref 0.2–1.2)
Total Protein: 6.8 g/dL (ref 6.0–8.3)

## 2017-02-12 NOTE — Patient Instructions (Addendum)
Thank you for choosing [facility].  SUMMARY AND INSTRUCTIONS:  Medication:  Your prescription(s) have been submitted to your pharmacy or been printed and provided for you. Please take as directed and contact our office if you believe you are having problem(s) with the medication(s) or have any questions.  Labs:  Please stop by the lab on the lower level of the building for your blood work. Your results will be released to MyChart (or called to you) after review, usually within 72 hours after test completion. If any changes need to be made, you will be notified at that same time.  1.) The lab is open from 7:30am to 5:30 pm Monday-Friday 2.) No appointment is necessary 3.) Fasting (if needed) is 6-8 hours after food and drink; black coffee and water are okay   Follow up:  If your symptoms worsen or fail to improve, please contact our office for further instruction, or in case of emergency go directly to the emergency room at the closest medical facility.     

## 2017-02-12 NOTE — Progress Notes (Signed)
Subjective:    Patient ID: [name], male    DOB: Nov 07, 1956, 61 y.o.   MRN: [id number]  Chief Complaint  Patient presents with  . Liver checked    wants blood work to check liver, thinks he is retaining fluid    HPI:  [name] is a 61 y.o. male who  has a past medical history of Arthritis; Hepatitis C; Hiatal hernia; Personal history of adenomatous colonic polyps (08/05/2012); and Pneumonia (age 72). and presents today for a follow up office visit.  1.) Chronic Hepatitis C - Previously diagnosed with Hepatitis C and experienced a viral type syndrome following a hepatitis vaccination. Has new onset concern for liver dysfunction. Endorses that he was drinking 2-3 alcoholic drinks at a setting over the course of the last couple of years. He has also experienced significant amount of weight gain since his last office visit. Notes that he has worked to lose weight but notes that he feels like he is retaining water. Has experienced the associated symptom of RUQ pain that is described as dull ache that is constant with a mild/moderate severity.   No Known Allergies    Outpatient Medications Prior to Visit  Medication Sig Dispense Refill  . ALPRAZolam (XANAX) 1 MG tablet TAKE 1 TABLET BY MOUTH TWICE DAILY AS NEEDED FOR ANXIETY *NEED OV FOR REFILLS* 40 tablet 0  . ribavirin (REBETOL) 200 MG capsule Take 3 capsules (600 mg total) by mouth 2 (two) times daily. 168 capsule 5  . Sofosbuvir 400 MG TABS Take 1 tablet by mouth daily. 28 tablet 5  . VYVANSE 60 MG capsule      No facility-administered medications prior to visit.       Past Surgical History:  Procedure Laterality Date  . COLONOSCOPY    . TONSILLECTOMY  1979  . TOTAL HIP ARTHROPLASTY  2006   left  . TOTAL HIP ARTHROPLASTY Right 11/06/2013   Procedure: RIGHT TOTAL HIP ARTHROPLASTY;  Surgeon: Loanne Drilling, MD;  Location: [facility];  Service: Orthopedics;  Laterality: Right;  . UMBILICAL HERNIA REPAIR  2010   right       Past Medical History:  Diagnosis Date  . Arthritis   . Hepatitis C   . Hiatal hernia   . Personal history of adenomatous colonic polyps 08/05/2012   8 mm tubulovillous adenoma 2010 08/05/2012 no polyps - next routine exam 2018    . Pneumonia age 34   hx of      Review of Systems  Constitutional: Negative for chills and fever.  Respiratory: Negative for chest tightness and shortness of breath.   Cardiovascular: Negative for chest pain.  Gastrointestinal: Positive for abdominal pain. Negative for constipation, diarrhea, nausea and vomiting.      Objective:    BP (!) 144/86 (BP Location: Left Arm, Patient Position: Sitting, Cuff Size: Large)   Pulse 82   Temp 98.3 F (36.8 C) (Oral)   Resp 16   Ht 6\' 4"  (1.93 m)   Wt (!) 340 lb (154.2 kg)   SpO2 97%   BMI 41.39 kg/m  Nursing note and vital signs reviewed.  Physical Exam  Constitutional: He is oriented to person, place, and time. He appears well-developed and well-nourished. No distress.  Cardiovascular: Normal rate, regular rhythm, normal heart sounds and intact distal pulses.   Pulmonary/Chest: Effort normal and breath sounds normal.  Abdominal: Normal appearance. He exhibits no mass. There is no hepatosplenomegaly. There is tenderness in the right upper quadrant. There  is no rigidity, no rebound, no guarding, no tenderness at McBurney's point and negative Murphy's sign. No hernia.  Neurological: He is alert and oriented to person, place, and time.  Skin: Skin is warm and dry.  Psychiatric: He has a normal mood and affect. His behavior is normal. Judgment and thought content normal.       Assessment & Plan:   Problem List Items Addressed This Visit      Digestive   Chronic hepatitis C without hepatic coma - genotype 2 - Primary    Previous treatment of Hepatitis C with completion of Harvoni. New onset RUQ pain/discomfort with no other liver related symptoms. Concern for cirrhosis with progression of cirrhosis.  Obtain Hepatitis C RNA, hepatic panel, BMET and CBC. Obtain ultrasound and consider elastibility study. Follow up pending blood work.       Relevant Orders   Hepatic function panel (Completed)   Basic Metabolic Panel (BMET) (Completed)   CBC w/Diff (Completed)   Hepatitis C RNA quantitative   US Abdomen Limited RUQ     Other   Obesity    New onset weight gain over the past 2 years of close to 70 pounds and currently working on weight loss. BMI 41. Recommend weight loss of 5-10% of current body weight through nutrition and physical activity. Trial lifestyle management but does qualify for weight loss medications. Encouraged nutritional intake that is moderate, varied and balanced. Goal physical activity of 30 minutes of moderate level activity daily. Consider Weight Watchers, Doylene BodeJenny Craig, or Nutrisystem. Continue to monitor.           I have discontinued Mr. Bard HerbertStadnik's Sofosbuvir, ribavirin, and VYVANSE. I am also having him maintain his ALPRAZolam.   Follow-up: Return in about 1 month (around 03/15/2017), or if symptoms worsen or fail to improve.  Jeanine Luzalone, Gregory, FNP

## 2017-02-12 NOTE — Assessment & Plan Note (Signed)
New onset weight gain over the past 2 years of close to 70 pounds and currently working on weight loss. BMI 41. Recommend weight loss of 5-10% of current body weight through nutrition and physical activity. Trial lifestyle management but does qualify for weight loss medications. Encouraged nutritional intake that is moderate, varied and balanced. Goal physical activity of 30 minutes of moderate level activity daily. Consider Weight Watchers, Doylene BodeJenny Craig, or Nutrisystem. Continue to monitor.

## 2017-02-12 NOTE — Assessment & Plan Note (Addendum)
Previous treatment of Hepatitis C with completion of Harvoni. New onset RUQ pain/discomfort with no other liver related symptoms. Concern for cirrhosis with progression of cirrhosis. Obtain Hepatitis C RNA, hepatic panel, BMET and CBC. Obtain ultrasound and consider elastibility study. Follow up pending blood work.

## 2017-02-14 ENCOUNTER — Encounter: Payer: Self-pay | Admitting: Family

## 2017-02-14 LAB — HEPATITIS C RNA QUANTITATIVE
HCV Quantitative Log: <1.18 Log IU/mL
HCV Quantitative: <15 IU/mL

## 2017-02-21 ENCOUNTER — Ambulatory Visit
Admission: RE | Admit: 2017-02-21 | Discharge: 2017-02-21 | Disposition: A | Discharge status: Home / Self Care | Payer: BLUE CROSS/BLUE SHIELD | Source: Ambulatory Visit | Attending: Family | Admitting: Family

## 2017-02-21 DIAGNOSIS — B182 Chronic viral hepatitis C: Secondary | ICD-10-CM

## 2017-02-21 DIAGNOSIS — B1920 Unspecified viral hepatitis C without hepatic coma: Secondary | ICD-10-CM | POA: Diagnosis not present

## 2017-02-22 ENCOUNTER — Encounter: Payer: Self-pay | Admitting: Family

## 2017-10-22 ENCOUNTER — Encounter: Payer: Self-pay | Admitting: Internal Medicine

## 2018-01-10 ENCOUNTER — Encounter: Payer: Self-pay | Admitting: Family

## 2018-01-10 ENCOUNTER — Telehealth: Payer: Self-pay | Admitting: Family

## 2018-01-10 ENCOUNTER — Ambulatory Visit: Payer: BLUE CROSS/BLUE SHIELD | Admitting: Family

## 2018-01-10 DIAGNOSIS — M545 Low back pain: Secondary | ICD-10-CM | POA: Diagnosis not present

## 2018-01-10 DIAGNOSIS — E669 Obesity, unspecified: Secondary | ICD-10-CM | POA: Diagnosis not present

## 2018-01-10 MED ORDER — METHOCARBAMOL 500 MG PO TABS: 500.0000 mg | ORAL_TABLET | Freq: Four times a day (QID) | ORAL | 0 refills | Status: DC | PRN

## 2018-01-10 MED ORDER — METHYLPREDNISOLONE ACETATE 80 MG/ML IJ SUSP
80.0000 mg | Freq: Once | INTRAMUSCULAR | Status: AC
  Administered 2018-01-10: 80 mg via INTRAMUSCULAR

## 2018-01-10 MED ORDER — PREDNISONE 10 MG (21) PO TBPK: ORAL_TABLET | ORAL | 0 refills | Status: DC

## 2018-01-10 NOTE — Telephone Encounter (Signed)
Notified Flow Coordinator about pt's level of pain; was advised since Ria ClockLaura Murray is no longer avail. Today, the pt. Should go to the ER with the intense pain he is having, or he can be seen in the Saturday clinic.  Phone call returned to the pt.  Advised of the above.  Requested to make a Saturday appt. And if his pain becomes completely unmanageable, he will go to the ER tonight.  Appt. given for 01/11/18 @ 9:45 AM.

## 2018-01-10 NOTE — Progress Notes (Signed)
[name] is a 62 y.o. male with the following history as recorded in Epic[employer]:  Patient Active Problem List   Diagnosis Date Noted  . Back pain 01/12/2018  . Obesity 02/12/2017  . Insomnia 10/01/2014  . Joint pain 09/14/2014  . Arthralgia 09/07/2014  . Hepatic cirrhosis due to chronic hepatitis C infection (HCC) 09/07/2014  . Chronic hepatitis C without hepatic coma - genotype 2 07/02/2014  . OA (osteoarthritis) of hip 11/06/2013  . Personal history of adenomatous colonic polyps 08/05/2012    Current Outpatient Medications  Medication Sig Dispense Refill  . gabapentin (NEURONTIN) 100 MG capsule Take 1 capsule (100 mg total) by mouth 3 (three) times daily. 90 capsule 3  . methocarbamol (ROBAXIN) 500 MG tablet Take 1 tablet (500 mg total) by mouth every 6 (six) hours as needed for muscle spasms. 30 tablet 0  . predniSONE (STERAPRED UNI-PAK 21 TAB) 10 MG (21) TBPK tablet Taper as directed 21 tablet 0  . traMADol (ULTRAM) 50 MG tablet Take 2 tablets (100 mg total) by mouth every 8 (eight) hours as needed. 60 tablet 1   No current facility-administered medications for this visit.     Allergies: Patient has no known allergies.  Past Medical History:  Diagnosis Date  . Arthritis   . Hepatitis C   . Hiatal hernia   . Personal history of adenomatous colonic polyps 08/05/2012   8 mm tubulovillous adenoma 2010 08/05/2012 no polyps - next routine exam 2018    . Pneumonia age 43   hx of    Past Surgical History:  Procedure Laterality Date  . COLONOSCOPY    . TONSILLECTOMY  1979  . TOTAL HIP ARTHROPLASTY  2006   left  . TOTAL HIP ARTHROPLASTY Right 11/06/2013   Procedure: RIGHT TOTAL HIP ARTHROPLASTY;  Surgeon: Loanne Drilling, MD;  Location: [facility];  Service: Orthopedics;  Laterality: Right;  . UMBILICAL HERNIA REPAIR  2010   right    Family History  Problem Relation Age of Onset  . Colon cancer Neg Hx   . Esophageal cancer Neg Hx   . Stomach cancer Neg Hx   . Rectal cancer  Neg Hx   . Clotting disorder Father   . Stroke Father     Social History   Tobacco Use  . Smoking status: Never Smoker  . Smokeless tobacco: Never Used  Substance Use Topics  . Alcohol use: No    Alcohol/week: 1.2 - 1.8 oz    Types: 2 - 3 Cans of beer per week    Subjective:  Low back pain x 1 week; no numbness or tingling; no relief with Ibuprofen; notes that changing positions/ walking makes the symptoms worse; no changes with bowel or bladder habits; feels the injury is most likely due recent change in his work-out routine;  Would also like to discuss options to help him work on weight loss; feels like he is eating well/ exercising almost daily and just can't seem to lose weight; not necessarily interested in surgery at this time;  Objective:  Vitals:   01/10/18 1133  BP: 140/88  Pulse: 68  Temp: 98.6 F (37 C)  TempSrc: Oral  SpO2: 98%  Weight: (!) 331 lb (150.1 kg)  Height: 6\' 4"  (1.93 m)    General: Well developed, well nourished, in no acute distress  Skin : Warm and dry.  Head: Normocephalic and atraumatic  Eyes: Sclera and conjunctiva clear; pupils round and reactive to light; extraocular movements intact  Lungs:  Respirations unlabored; clear to auscultation bilaterally without wheeze, rales, rhonchi  CVS exam: normal rate and regular rhythm.  Musculoskeletal: No deformities; no active joint inflammation  Extremities: No edema, cyanosis, clubbing  Vessels: Symmetric bilaterally  Neurologic: Alert and oriented; speech intact; face symmetrical; moves all extremities well; CNII-XII intact without focal deficit   Assessment:  1. Low back pain, unspecified back pain laterality, unspecified chronicity, with sciatica presence unspecified   2. Obesity, unspecified classification, unspecified obesity type, unspecified whether serious comorbidity present     Plan:  1. Appears muscular; will hold imaging at this time; Rx for Depo-Medrol IM 80 given in office; start  Prednisone taper pack tomorrow; Rx for Robaxin 500 mg tid prn; apply heat/ rest; follow-up worse, no better;  2. Referral to weight loss specialist per patient request.   No Follow-up on file.  Orders Placed This Encounter  Procedures  . Amb Ref to Medical Weight Management    Referral Priority:   Routine    Referral Type:   Consultation    Referred to Provider:   Wilder GladeBeasley, Caren D, MD    Number of Visits Requested:   1    Requested Prescriptions   Signed Prescriptions Disp Refills  . methocarbamol (ROBAXIN) 500 MG tablet 30 tablet 0    Sig: Take 1 tablet (500 mg total) by mouth every 6 (six) hours as needed for muscle spasms.  . predniSONE (STERAPRED UNI-PAK 21 TAB) 10 MG (21) TBPK tablet 21 tablet 0    Sig: Taper as directed

## 2018-01-10 NOTE — Progress Notes (Signed)
me

## 2018-01-10 NOTE — Telephone Encounter (Signed)
Pt. called to report he has continues to have severe pain across the low back to just above the kidneys.  Stated "it is so intense that it feels like I'm "being tazed", at times. Reported the pain is "like a jolting" that comes in waves. Stated that "it almost feels like it is radiating around to my abdomen, due to the pushing back on the pain."  Reported he rec'd the steroid injection about 12:00 PM, and took a dose of the muscle relaxant about 1:30 PM.  Is asking if anything can be done further for the intense pain; asking if there is anything else that can help with the pain?   Advised will notify Ria ClockLaura Murray of his pain level.

## 2018-01-11 ENCOUNTER — Ambulatory Visit (INDEPENDENT_AMBULATORY_CARE_PROVIDER_SITE_OTHER): Payer: BLUE CROSS/BLUE SHIELD | Admitting: Internal Medicine

## 2018-01-11 ENCOUNTER — Encounter: Payer: Self-pay | Admitting: Internal Medicine

## 2018-01-11 VITALS — BP 142/84 | HR 72 | Temp 99.2°F | Resp 18 | Ht 76.0 in | Wt 330.0 lb

## 2018-01-11 DIAGNOSIS — M545 Low back pain, unspecified: Secondary | ICD-10-CM

## 2018-01-11 LAB — POCT URINALYSIS DIPSTICK
Bilirubin, UA: NEGATIVE
Blood, UA: NEGATIVE
Glucose, UA: NEGATIVE
Ketones, UA: NEGATIVE
Leukocytes, UA: NEGATIVE
Nitrite, UA: NEGATIVE
Protein, UA: NEGATIVE
Spec Grav, UA: 1.015 (ref 1.010–1.025)
Urobilinogen, UA: 1 E.U./dL
pH, UA: 7.5 (ref 5.0–8.0)

## 2018-01-11 MED ORDER — TRAMADOL HCL 50 MG PO TABS: 100.0000 mg | ORAL_TABLET | Freq: Three times a day (TID) | ORAL | 1 refills | Status: DC | PRN

## 2018-01-11 MED ORDER — GABAPENTIN 100 MG PO CAPS: 100.0000 mg | ORAL_CAPSULE | Freq: Three times a day (TID) | ORAL | 3 refills | Status: DC

## 2018-01-11 NOTE — Progress Notes (Signed)
Subjective:  Patient ID: [name], male    DOB: 02/10/1956  Age: 62 y.o. MRN: [id number]  CC: The encounter diagnosis was Acute bilateral low back pain without sciatica.  HPI [name] presents for  Back pain  Low back pain,  Bilateral, started last weekend afer working out harder at the gym.  Recalls that his workout included more abdominal  crunches on a weight machine,  Followed by a  4 mile walk.. Pain started the following day,  In the lower back in the paraspinus muscles and radiated upward to both kidneys. Received steroid shot and prednisone taper  On Feb 15,  But overnight the pain became worse and intolerable, described as excruciating ,  Feels like a Tazer. ,   Better this  morning.  ,  Took ibuprofen  Last night  No foot drop,  no pain radiating to ankle.   No hematuria.  Has chronic bilateral neuropathy affecting feet from vaccination for Hep B./Pneumovax priro to treatment with Harvoni    Outpatient Medications Prior to Visit  Medication Sig Dispense Refill  . methocarbamol (ROBAXIN) 500 MG tablet Take 1 tablet (500 mg total) by mouth every 6 (six) hours as needed for muscle spasms. 30 tablet 0  . predniSONE (STERAPRED UNI-PAK 21 TAB) 10 MG (21) TBPK tablet Taper as directed 21 tablet 0   No facility-administered medications prior to visit.     Review of Systems;  Patient denies headache, fevers, malaise, unintentional weight loss, skin rash, eye pain, sinus congestion and sinus pain, sore throat, dysphagia,  hemoptysis , cough, dyspnea, wheezing, chest pain, palpitations, orthopnea, edema, abdominal pain, nausea, melena, diarrhea, constipation, flank pain, dysuria, hematuria, urinary  Frequency, nocturia, numbness, tingling, seizures,  Focal weakness, Loss of consciousness,  Tremor, insomnia, depression, anxiety, and suicidal ideation.      Objective:  BP (!) 142/84 (BP Location: Left Arm, Patient Position: Sitting, Cuff Size: Large)   Pulse 72   Temp 99.2 F  (37.3 C) (Oral)   Resp 18   Ht 6\' 4"  (1.93 m)   Wt (!) 330 lb (149.7 kg)   SpO2 98%   BMI 40.17 kg/m   BP Readings from Last 3 Encounters:  01/11/18 (!) 142/84  01/10/18 140/88  02/12/17 (!) 144/86    Wt Readings from Last 3 Encounters:  01/11/18 (!) 330 lb (149.7 kg)  01/10/18 (!) 331 lb (150.1 kg)  02/12/17 (!) 340 lb (154.2 kg)    General appearance: alert, cooperative and appears stated age Ears: normal TM's and external ear canals both ears Throat: lips, mucosa, and tongue normal; teeth and gums normal Neck: no adenopathy, no carotid bruit, supple, symmetrical, trachea midline and thyroid not enlarged, symmetric, no tenderness/mass/nodules Back: symmetric, no curvature. ROM normal. paraspinous muscle tenderness,  No spinal or CVA tenderness. Lungs: clear to auscultation bilaterally Heart: regular rate and rhythm, S1, S2 normal, no murmur, click, rub or gallop Abdomen: soft, non-tender; bowel sounds normal; no masses,  no organomegaly Pulses: 2+ and symmetric Skin: Skin color, texture, turgor normal. No rashes or lesions MSK: negative straight leg lift   Normal patellar and achilles DTRs Lymp nodes: Cervical, supraclavicular, and axillary nodes normal.  No results found for: HGBA1C  Lab Results  Component Value Date   CREATININE 0.91 02/12/2017   CREATININE 0.96 02/14/2015   CREATININE 0.90 11/29/2014    Lab Results  Component Value Date   WBC 7.2 02/12/2017   HGB 15.1 02/12/2017   HCT 43.4 02/12/2017   PLT  244.0 02/12/2017   GLUCOSE 107 (H) 02/12/2017   ALT 17 02/12/2017   AST 8 02/12/2017   NA 140 02/12/2017   K 4.0 02/12/2017   CL 107 02/12/2017   CREATININE 0.91 02/12/2017   BUN 19 02/12/2017   CO2 26 02/12/2017   TSH 1.30 10/01/2014   INR 1.0 07/23/2014      Assessment & Plan:   Problem List Items Addressed This Visit    Back pain - Primary    UA normal.  No warning signs.  Adding tramadol to regimen of steroid taper,  MR's.        Relevant Medications   traMADol (ULTRAM) 50 MG tablet   Other Relevant Orders   POCT urinalysis dipstick (Completed)      I am having [name] start on gabapentin. I am also having him maintain his methocarbamol, predniSONE, and traMADol.  Meds ordered this encounter  Medications  . DISCONTD: traMADol (ULTRAM) 50 MG tablet    Sig: Take 2 tablets (100 mg total) by mouth every 8 (eight) hours as needed.    Dispense:  60 tablet    Refill:  1  . gabapentin (NEURONTIN) 100 MG capsule    Sig: Take 1 capsule (100 mg total) by mouth 3 (three) times daily.    Dispense:  90 capsule    Refill:  3  . DISCONTD: traMADol (ULTRAM) 50 MG tablet    Sig: Take 2 tablets (100 mg total) by mouth every 8 (eight) hours as needed.    Dispense:  60 tablet    Refill:  1  . traMADol (ULTRAM) 50 MG tablet    Sig: Take 2 tablets (100 mg total) by mouth every 8 (eight) hours as needed.    Dispense:  60 tablet    Refill:  1    Medications Discontinued During This Encounter  Medication Reason  . traMADol (ULTRAM) 50 MG tablet Reorder  . traMADol (ULTRAM) 50 MG tablet Reorder    Follow-up: No Follow-up on file.   Sherlene Shams, MD

## 2018-01-11 NOTE — Patient Instructions (Addendum)
Suspend the ibuprofen while you are taking prednisone (to prevent gastritis)  I am prescribing a pain reliever called Tramadol 1-2 tablets every 6 hours as needed for pain  I am also giving you a Trial of gabapentin 100 mg three times daily for additional pain control due to inflammation

## 2018-01-11 NOTE — Progress Notes (Signed)
opended in error

## 2018-01-11 NOTE — Progress Notes (Signed)
Pre visit review using our clinic review tool, if applicable. No additional management support is needed unless otherwise documented below in the visit note. 

## 2018-01-12 DIAGNOSIS — M549 Dorsalgia, unspecified: Secondary | ICD-10-CM | POA: Insufficient documentation

## 2018-01-12 NOTE — Assessment & Plan Note (Signed)
UA normal.  No warning signs.  Adding tramadol to regimen of steroid taper,  MR's.

## 2018-01-13 ENCOUNTER — Telehealth: Payer: Self-pay | Admitting: Family

## 2018-01-13 ENCOUNTER — Telehealth: Payer: Self-pay

## 2018-01-13 NOTE — Telephone Encounter (Signed)
Advised laura,NP, fyi.Marland Kitchen..Marland Kitchen

## 2018-01-13 NOTE — Telephone Encounter (Signed)
Routing back to [name] and also creating CRM in case patient calls back---patient is much better compared to Saturday---it is more manageable, he is mobile, just sore---patient advised that if he worsens between now and end of week, to call back per [name]'s advisement to have either xray or MRI of back, if patient calls back, [facility] or can schedule appt with [name]

## 2018-01-13 NOTE — Telephone Encounter (Signed)
Please call and check on him today; saw that he had to come back to the clinic in Saturday; if no improvement, would like to update X-ray of his low back and may actually MRI of back. Please let me know his response.

## 2018-01-13 NOTE — Telephone Encounter (Signed)
Patient seen Friday 2/15 and also Saturday clinic 2/16--back pain---if patient calls back with worsening back pain, can either schedule appt with laura for xray or MRI of back, or can talk with tamara,RN at [facility]--crm created

## 2018-04-14 ENCOUNTER — Other Ambulatory Visit (INDEPENDENT_AMBULATORY_CARE_PROVIDER_SITE_OTHER): Payer: BLUE CROSS/BLUE SHIELD

## 2018-04-14 ENCOUNTER — Ambulatory Visit: Payer: BLUE CROSS/BLUE SHIELD | Admitting: Nurse Practitioner

## 2018-04-14 ENCOUNTER — Encounter: Payer: Self-pay | Admitting: Nurse Practitioner

## 2018-04-14 VITALS — BP 136/84 | HR 85 | Temp 99.2°F | Resp 16 | Ht 76.0 in | Wt 317.0 lb

## 2018-04-14 DIAGNOSIS — Z125 Encounter for screening for malignant neoplasm of prostate: Secondary | ICD-10-CM

## 2018-04-14 DIAGNOSIS — F419 Anxiety disorder, unspecified: Secondary | ICD-10-CM

## 2018-04-14 DIAGNOSIS — Z13220 Encounter for screening for lipoid disorders: Secondary | ICD-10-CM

## 2018-04-14 DIAGNOSIS — R5383 Other fatigue: Secondary | ICD-10-CM | POA: Diagnosis not present

## 2018-04-14 DIAGNOSIS — K7689 Other specified diseases of liver: Secondary | ICD-10-CM

## 2018-04-14 DIAGNOSIS — K760 Fatty (change of) liver, not elsewhere classified: Secondary | ICD-10-CM | POA: Diagnosis not present

## 2018-04-14 LAB — COMPREHENSIVE METABOLIC PANEL
ALT: 16 U/L (ref 0–53)
AST: 7 U/L (ref 0–37)
Albumin: 4.6 g/dL (ref 3.5–5.2)
Alkaline Phosphatase: 47 U/L (ref 39–117)
BUN: 21 mg/dL (ref 6–23)
CO2: 27 mEq/L (ref 19–32)
Calcium: 9.3 mg/dL (ref 8.4–10.5)
Chloride: 104 mEq/L (ref 96–112)
Creatinine, Ser: 0.9 mg/dL (ref 0.40–1.50)
GFR: 91.41 mL/min (ref >60.00)
Glucose, Bld: 94 mg/dL (ref 70–99)
Potassium: 3.9 mEq/L (ref 3.5–5.1)
Sodium: 140 mEq/L (ref 135–145)
Total Bilirubin: 1.5 mg/dL — ABNORMAL HIGH (ref 0.2–1.2)
Total Protein: 7.3 g/dL (ref 6.0–8.3)

## 2018-04-14 LAB — CBC
HCT: 46 % (ref 39.0–52.0)
Hemoglobin: 15.8 g/dL (ref 13.0–17.0)
MCHC: 34.4 g/dL (ref 30.0–36.0)
MCV: 92.2 fl (ref 78.0–100.0)
Platelets: 270 10*3/uL (ref 150.0–400.0)
RBC: 4.99 Mil/uL (ref 4.22–5.81)
RDW: 13.4 % (ref 11.5–15.5)
WBC: 8.5 10*3/uL (ref 4.0–10.5)

## 2018-04-14 LAB — LIPID PANEL
Cholesterol: 165 mg/dL (ref 0–200)
HDL: 48.7 mg/dL (ref >39.00)
LDL Cholesterol: 92 mg/dL (ref 0–99)
NonHDL: 115.87
Total CHOL/HDL Ratio: 3
Triglycerides: 120 mg/dL (ref 0.0–149.0)
VLDL: 24 mg/dL (ref 0.0–40.0)

## 2018-04-14 LAB — PSA: PSA: 0.51 ng/mL (ref 0.10–4.00)

## 2018-04-14 LAB — TSH: TSH: 1.48 u[IU]/mL (ref 0.35–4.50)

## 2018-04-14 MED ORDER — ESCITALOPRAM OXALATE 10 MG PO TABS: 10.0000 mg | ORAL_TABLET | Freq: Every day | ORAL | 1 refills | Status: DC

## 2018-04-14 NOTE — Patient Instructions (Addendum)
Please head downstairs for lab work/x-rays. If any of your test results are critically abnormal, you will be contacted right away. Your results may be released to your MyChart for viewing before I am able to provide you with my response. I will contact you within a week about your test results and any recommendations for abnormalities.   I have sent a prescription for lexapro  tablets to your pharmacy. Please start 1/2 tablet once daily for 1 week and then increase to a full tablet once daily on week two as tolerated.  Some side effects such as nausea, drowsiness and weight gain can occur.  Also rarely people have experienced suicidal thoughts when taking this medication.  Please discontinue the medication and go directly to ED if this occurs.  Id like to see you back in about 1 month to evaluate progress.     Please return in about 1 month so I can see how you are doing    Fatigue Fatigue is feeling tired all of the time, a lack of energy, or a lack of motivation. Occasional or mild fatigue is often a normal response to activity or life in general. However, long-lasting (chronic) or extreme fatigue may indicate an underlying medical condition. Follow these instructions at home: Watch your fatigue for any changes. The following actions may help to lessen any discomfort you are feeling:  Talk to your health care provider about how much sleep you need each night. Try to get the required amount every night.  Take medicines only as directed by your health care provider.  Eat a healthy and nutritious diet. Ask your health care provider if you need help changing your diet.  Drink enough fluid to keep your urine clear or pale yellow.  Practice ways of relaxing, such as yoga, meditation, massage therapy, or acupuncture.  Exercise regularly.  Change situations that cause you stress. Try to keep your work and personal routine reasonable.  Do not abuse illegal drugs.  Limit alcohol intake to  no more than 1 drink per day for nonpregnant women and 2 drinks per day for men. One drink equals 12 ounces of beer, 5 ounces of wine, or 1 ounces of hard liquor.  Take a multivitamin, if directed by your health care provider.  Contact a health care provider if:  Your fatigue does not get better.  You have a fever.  You have unintentional weight loss or gain.  You have headaches.  You have difficulty: ? Falling asleep. ? Sleeping throughout the night.  You feel angry, guilty, anxious, or sad.  You are unable to have a bowel movement (constipation).  You skin is dry.  Your legs or another part of your body is swollen. Get help right away if:  You feel confused.  Your vision is blurry.  You feel faint or pass out.  You have a severe headache.  You have severe abdominal, pelvic, or back pain.  You have chest pain, shortness of breath, or an irregular or fast heartbeat.  You are unable to urinate or you urinate less than normal.  You develop abnormal bleeding, such as bleeding from the rectum, vagina, nose, lungs, or nipples.  You vomit blood.  You have thoughts about harming yourself or committing suicide.  You are worried that you might harm someone else. This information is not intended to replace advice given to you by your health care provider. Make sure you discuss any questions you have with your health care provider. Document Released: 09/09/2007  Document Revised: 04/19/2016 Document Reviewed: 03/16/2014 Elsevier Interactive Patient Education  [employer].

## 2018-04-14 NOTE — Progress Notes (Signed)
Name: [name]   MRN: [id number]    DOB: [birth year]   Date:04/14/2018       Progress Note  Subjective  Chief Complaint  Chief Complaint  Patient presents with  . Establish Care    continue monitor on liver and would like to get on anxiety meds that he was on in the past    HPI [name] is establishing care with me today, transferring from another provider in the same practice. He is requesting follow up of liver function and medication for anxiety. He is not currently maintained on any daily medications, does not routinely see any specialty providers.  Liver dysfunction- He was treated with cure for hepatitis C around 2016. He had last abdominal ultrasound in March 2018, with following  IMPRESSION: Fatty liver. Stable hepatic cyst Hepatic function panel and BMET on 02/12/17 were WNL and hepatitis C quantitative on 02/12/17 was normal. He denies weight changes, loss of appetite, abdominal pain, nausea, vomiting, constipation, diarrhea, skin changes He reports some fatigue, which he noticed before he received hep c treatment, seemed to improve with hepatits C treatment, now has returned.  Anxiety- This is not a new problem He has noticed as he has gotten older he seems to be more worried about daily things, for example normal situations at his job make him feel very stressed overwhelmed. He often  feels restless and uneasy. He was on xanax 1-2 times/day in the past and is requesting to resume daily dosing of xanax for his anxiety. He was on zoloft in the past but did not like the way it felt, interfered with his sleep.  Patient Active Problem List   Diagnosis Date Noted  . Back pain 01/12/2018  . Obesity 02/12/2017  . Insomnia 10/01/2014  . Joint pain 09/14/2014  . Arthralgia 09/07/2014  . Hepatic cirrhosis due to chronic hepatitis C infection (HCC) 09/07/2014  . Chronic hepatitis C without hepatic coma - genotype 2 07/02/2014  . OA (osteoarthritis) of hip 11/06/2013  .  Personal history of adenomatous colonic polyps 08/05/2012    Past Surgical History:  Procedure Laterality Date  . COLONOSCOPY    . TONSILLECTOMY  1979  . TOTAL HIP ARTHROPLASTY  2006   left  . TOTAL HIP ARTHROPLASTY Right 11/06/2013   Procedure: RIGHT TOTAL HIP ARTHROPLASTY;  Surgeon: Loanne Drilling, MD;  Location: [facility];  Service: Orthopedics;  Laterality: Right;  . UMBILICAL HERNIA REPAIR  2010   right    Family History  Problem Relation Age of Onset  . Clotting disorder Father   . Stroke Father   . Colon cancer Neg Hx   . Esophageal cancer Neg Hx   . Stomach cancer Neg Hx   . Rectal cancer Neg Hx     Social History   Socioeconomic History  . Marital status: Married    Spouse name: Not on file  . Number of children: 2  . Years of education: Not on file  . Highest education level: Not on file  Occupational History  . Occupation: Investment banker, corporate: [employer]  Social Needs  . Financial resource strain: Not on file  . Food insecurity:    Worry: Not on file    Inability: Not on file  . Transportation needs:    Medical: Not on file    Non-medical: Not on file  Tobacco Use  . Smoking status: Never Smoker  . Smokeless tobacco: Never Used  Substance and Sexual Activity  . Alcohol use:  No    Alcohol/week: 1.2 - 1.8 oz    Types: 2 - 3 Cans of beer per week  . Drug use: No  . Sexual activity: Yes    Partners: Female  Lifestyle  . Physical activity:    Days per week: Not on file    Minutes per session: Not on file  . Stress: Not on file  Relationships  . Social connections:    Talks on phone: Not on file    Gets together: Not on file    Attends religious service: Not on file    Active member of club or organization: Not on file    Attends meetings of clubs or organizations: Not on file    Relationship status: Not on file  . Intimate partner violence:    Fear of current or ex partner: Not on file    Emotionally abused: Not on file    Physically  abused: Not on file    Forced sexual activity: Not on file  Other Topics Concern  . Not on file  Social History Narrative   married, 2 sons and 2 daughters employed in [employer]. One caffeinated beverage daily    No current outpatient medications on file.  No Known Allergies   ROS See HPI  Objective  Vitals:   04/14/18 1452  BP: 136/84  Pulse: 85  Resp: 16  Temp: 99.2 F (37.3 C)  TempSrc: Oral  SpO2: 94%  Weight: (!) 317 lb (143.8 kg)  Height:  (1.93 m)    Body mass index is 38.59 kg/m.  Physical Exam Vital signs reviewed. Constitutional: Patient appears well-developed and well-nourished. Obese. No distress.  HENT: Head: Normocephalic and atraumatic.  Nose: Nose normal. Mouth/Throat: Oropharynx is clear and moist. No oropharyngeal exudate.  Eyes: Conjunctivae and EOM are normal. Pupils are equal, round, and reactive to light. No scleral icterus.  Neck: Normal range of motion. Neck supple.  Cardiovascular: Normal rate, regular rhythm and normal heart sounds. No BLE edema. Distal pulses intact. Pulmonary/Chest: Effort normal and breath sounds normal. No respiratory distress. Abdominal: Soft, rotund. Bowel sounds are normal. There is no tenderness. no masses Neurological: he is alert and oriented to person, place, and time. Coordination, balance, strength, speech and gait are normal.  Skin: Skin is warm and dry. No rash noted. No erythema. No jaundice. Psychiatric: Patient has a normal mood and affect. behavior is normal.   PHQ2/9: Depression screen [facility] 2/9 01/10/2018 02/14/2015 09/07/2014 09/07/2014 08/25/2014  Decreased Interest 0 0 0 0 0  Down, Depressed, Hopeless 0 0 0 0 0  PHQ - 2 Score 0 0 0 0 0    Assessment & Plan RTC in 1 month for F/U: Fatigue-labs today, Anxiety- starting lexapro  -Reviewed Health Maintenance:  Screening for cholesterol level- Lipid panel; Future Screening for prostate cancer- PSA; Future  Other fatigue Update routine labs  today Discussed home management of fatigue including healthy diet and routine exercise and return precautions and printed additional information on AVS F/U with further recommendations pending test results, plan to F/U for OV in 1 month for now - CBC; Future - Comprehensive metabolic panel; Future - TSH; Future - Lipid panel; Future - PSA; Future

## 2018-04-15 ENCOUNTER — Encounter: Payer: Self-pay | Admitting: Nurse Practitioner

## 2018-04-15 DIAGNOSIS — K760 Fatty (change of) liver, not elsewhere classified: Secondary | ICD-10-CM | POA: Insufficient documentation

## 2018-04-15 DIAGNOSIS — K7689 Other specified diseases of liver: Secondary | ICD-10-CM | POA: Insufficient documentation

## 2018-04-15 DIAGNOSIS — F419 Anxiety disorder, unspecified: Secondary | ICD-10-CM | POA: Insufficient documentation

## 2018-04-15 NOTE — Assessment & Plan Note (Signed)
Update labs and imaging F/u with further recommendations pending test results - Comprehensive metabolic panel; Future - US Abdomen Limited RUQ; Future  

## 2018-04-15 NOTE — Assessment & Plan Note (Addendum)
Discussed long term risks of xanax including addiction and memory loss, he is not concerned about these side effects and still wishes to resume xanax, however I recommended safer first line treatment and best practice would be trying a second SSRI and provided Rx for lexapro with dosing and side effects disuccsed and printed on AVS Also Discussed the role of healthy diet and exercise in the management of anxiety and management of anxiety at home  - escitalopram (LEXAPRO) 10 MG tablet; Take 1 tablet (10 mg total) by mouth daily.  Dispense: 30 tablet; Refill: 1 - CBC; Future - Comprehensive metabolic panel; Future - TSH; Future

## 2018-04-15 NOTE — Assessment & Plan Note (Signed)
Update labs and imaging F/u with further recommendations pending test results - Comprehensive metabolic panel; Future - US Abdomen Limited RUQ; Future

## 2018-05-01 ENCOUNTER — Encounter: Payer: Self-pay | Admitting: Nurse Practitioner

## 2018-05-16 ENCOUNTER — Ambulatory Visit
Admission: RE | Admit: 2018-05-16 | Discharge: 2018-05-16 | Disposition: A | Discharge status: Home / Self Care | Payer: BLUE CROSS/BLUE SHIELD | Source: Ambulatory Visit | Attending: Nurse Practitioner | Admitting: Nurse Practitioner

## 2018-05-16 DIAGNOSIS — K7689 Other specified diseases of liver: Secondary | ICD-10-CM | POA: Diagnosis not present

## 2018-05-16 DIAGNOSIS — K760 Fatty (change of) liver, not elsewhere classified: Secondary | ICD-10-CM | POA: Diagnosis not present

## 2018-05-19 ENCOUNTER — Ambulatory Visit: Payer: BLUE CROSS/BLUE SHIELD | Admitting: Nurse Practitioner

## 2018-05-19 ENCOUNTER — Other Ambulatory Visit (INDEPENDENT_AMBULATORY_CARE_PROVIDER_SITE_OTHER): Payer: BLUE CROSS/BLUE SHIELD

## 2018-05-19 ENCOUNTER — Encounter: Payer: Self-pay | Admitting: Nurse Practitioner

## 2018-05-19 VITALS — BP 148/84 | HR 73 | Temp 98.9°F | Resp 16 | Ht 76.0 in | Wt 323.0 lb

## 2018-05-19 DIAGNOSIS — F419 Anxiety disorder, unspecified: Secondary | ICD-10-CM

## 2018-05-19 DIAGNOSIS — R899 Unspecified abnormal finding in specimens from other organs, systems and tissues: Secondary | ICD-10-CM | POA: Diagnosis not present

## 2018-05-19 DIAGNOSIS — R5383 Other fatigue: Secondary | ICD-10-CM | POA: Diagnosis not present

## 2018-05-19 LAB — COMPREHENSIVE METABOLIC PANEL
ALT: 13 U/L (ref 0–53)
AST: 7 U/L (ref 0–37)
Albumin: 4.5 g/dL (ref 3.5–5.2)
Alkaline Phosphatase: 46 U/L (ref 39–117)
BUN: 20 mg/dL (ref 6–23)
CO2: 27 mEq/L (ref 19–32)
Calcium: 9.6 mg/dL (ref 8.4–10.5)
Chloride: 104 mEq/L (ref 96–112)
Creatinine, Ser: 0.98 mg/dL (ref 0.40–1.50)
GFR: 82.83 mL/min (ref >60.00)
Glucose, Bld: 96 mg/dL (ref 70–99)
Potassium: 3.9 mEq/L (ref 3.5–5.1)
Sodium: 139 mEq/L (ref 135–145)
Total Bilirubin: 1.1 mg/dL (ref 0.2–1.2)
Total Protein: 7.3 g/dL (ref 6.0–8.3)

## 2018-05-19 LAB — BILIRUBIN, DIRECT: Bilirubin, Direct: 0.2 mg/dL (ref 0.0–0.3)

## 2018-05-19 LAB — HEMOGLOBIN A1C: Hgb A1c MFr Bld: 5.4 % (ref 4.6–6.5)

## 2018-05-19 LAB — VITAMIN D 25 HYDROXY (VIT D DEFICIENCY, FRACTURES): VITD: 37.81 ng/mL (ref 30.00–100.00)

## 2018-05-19 MED ORDER — VENLAFAXINE HCL 75 MG PO TABS: 75.0000 mg | ORAL_TABLET | Freq: Every day | ORAL | 1 refills | Status: AC

## 2018-05-19 NOTE — Assessment & Plan Note (Signed)
Unable to tolerate zoloft and lexapro, we discussed trial of effexor and he is agreeable- dosing and side effects discussed RTC in 1 month for F/U - venlafaxine (EFFEXOR) 75 MG tablet; Take 1 tablet (75 mg total) by mouth daily.  Dispense: 30 tablet; Refill: 1

## 2018-05-19 NOTE — Progress Notes (Signed)
Name: [name]   MRN: [id number]    DOB: 12/12/1955   Date:05/19/2018       Progress Note  Subjective  Chief Complaint  Chief Complaint  Patient presents with  . Follow-up    lexapro does not work for him     HPI Mr Ramnauth is here today for a follow up of anxiety and fatigue. He was started on lexpro at last OV on 5/20 for daily symptoms of anxiety and worry. He was requesting xanax 1-2 pills per day, as he found this prescription helpful for his anxiety in the past, but he had been off xanax for some time and we discussed that xanax as the first line of treatment for daily anxiety was not best practice. He told me that he did not like how he felt on trial of zoloft before so we decided to try lexapro. Today, he tells me that he took the lexapro for about 1 week but stopped due to side effects of headache and not able to sleep well. Since stopping his sleep has improved some but continues have trouble sleeping at night due to anxiety and worry. He says he continues to feel fatigued, tries to exercise daily and watch his dietary intake to improve his energy- decreased alcohol, increased water and vegetables, avoid sodium intake. He does report sometimes feeling tight in his throat, like he is not breathing well at night, especially since he has gained more weight, but has never been evaluated for sleep apnea   Patient Active Problem List   Diagnosis Date Noted  . Anxiety 04/15/2018  . Fatty liver 04/15/2018  . Hepatic cyst 04/15/2018  . Obesity 02/12/2017  . Insomnia 10/01/2014  . Arthralgia 09/07/2014  . Hepatic cirrhosis due to chronic hepatitis C infection (HCC) 09/07/2014  . Chronic hepatitis C without hepatic coma - genotype 2 07/02/2014  . OA (osteoarthritis) of hip 11/06/2013  . Personal history of adenomatous colonic polyps 08/05/2012    Past Surgical History:  Procedure Laterality Date  . COLONOSCOPY    . TONSILLECTOMY  1979  . TOTAL HIP ARTHROPLASTY  2006   left  .  TOTAL HIP ARTHROPLASTY Right 11/06/2013   Procedure: RIGHT TOTAL HIP ARTHROPLASTY;  Surgeon: Loanne Drilling, MD;  Location: [facility];  Service: Orthopedics;  Laterality: Right;  . UMBILICAL HERNIA REPAIR  2010   right    Family History  Problem Relation Age of Onset  . Clotting disorder Father   . Stroke Father   . Colon cancer Neg Hx   . Esophageal cancer Neg Hx   . Stomach cancer Neg Hx   . Rectal cancer Neg Hx     Social History   Socioeconomic History  . Marital status: Married    Spouse name: Not on file  . Number of children: 2  . Years of education: Not on file  . Highest education level: Not on file  Occupational History  . Occupation: Investment banker, corporate: [employer]  Social Needs  . Financial resource strain: Not on file  . Food insecurity:    Worry: Not on file    Inability: Not on file  . Transportation needs:    Medical: Not on file    Non-medical: Not on file  Tobacco Use  . Smoking status: Never Smoker  . Smokeless tobacco: Never Used  Substance and Sexual Activity  . Alcohol use: No    Alcohol/week: 1.2 - 1.8 oz    Types: 2 - 3  Cans of beer per week  . Drug use: No  . Sexual activity: Yes    Partners: Female  Lifestyle  . Physical activity:    Days per week: Not on file    Minutes per session: Not on file  . Stress: Not on file  Relationships  . Social connections:    Talks on phone: Not on file    Gets together: Not on file    Attends religious service: Not on file    Active member of club or organization: Not on file    Attends meetings of clubs or organizations: Not on file    Relationship status: Not on file  . Intimate partner violence:    Fear of current or ex partner: Not on file    Emotionally abused: Not on file    Physically abused: Not on file    Forced sexual activity: Not on file  Other Topics Concern  . Not on file  Social History Narrative   married, 2 sons and 2 daughters employed in [employer]. One caffeinated beverage  daily    No current outpatient medications on file.  No Known Allergies   ROS See HPI  Objective  Vitals:   05/19/18 1551  BP: (!) 148/84  Pulse: 73  Resp: 16  Temp: 98.9 F (37.2 C)  TempSrc: Oral  SpO2: 94%  Weight: (!) 323 lb (146.5 kg)  Height: 6\' 4"  (1.93 m)   Body mass index is 39.32 kg/m.  Physical Exam Vital signs reviewed. Constitutional: Patient appears well-developed and well-nourished. Obese. No distress.  HENT: Head: Normocephalic and atraumatic.  Nose: Nose normal. Mouth/Throat: Oropharynx is clear and moist. No oropharyngeal exudate.  Eyes: Conjunctivae and EOM are normal. Pupils are equal, round, and reactive to light. No scleral icterus.  Neck: Normal range of motion. Neck supple.  Cardiovascular: Normal rate, regular rhythm and normal heart sounds. No BLE edema. Distal pulses intact. Pulmonary/Chest: Effort normal and breath sounds normal. No respiratory distress. Neurological: he is alert and oriented to person, place, and time. Coordination, balance, strength, speech and gait are normal.  Skin: Skin is warm and dry. No rash noted. No erythema. No jaundice. Psychiatric: Patient has a normal mood and affect. behavior is normal. Judgement and thought content normal.  Assessment & Plan RTC in 1 month for F/U: Fatigue-additional labs today, Anxiety- starting effexor  Other fatigue Discussed home management of fatigue including the role of healthy diet and exercise in the management of fatigue and return precautions and printed additional information on AVS- he declines referral to nutrition education H&PE suspicious for sleep apnea but he declines referral to pulmonology for sleep study, we did discuss that sleep apnea could contribute to fatigue and risks of untreated sleep apnea including death but he prefers to try to lose weight on his own and see if symptoms improve Anxiety may also play a role in his fatigue- trial of effexor started  today Additional labs today, will consider additional recommendations based on lab work RTC in 1 month for F/U - Comprehensive metabolic panel; Future - VITAMIN D 25 Hydroxy (Vit-D Deficiency, Fractures); Future - Hemoglobin A1c; Future  Abnormal laboratory test Elevated bilirubin on 04/14/18 labs, will repeat today - Comprehensive metabolic panel; Future - Bilirubin, Direct; Future

## 2018-05-19 NOTE — Patient Instructions (Signed)
Please head downstairs for lab work/x-rays. If any of your test results are critically abnormal, you will be contacted right away. Otherwise, I will contact you within a week about your test results and any recommendations for abnormalities.   I have sent a prescription for effexor 75mg  tablets to your pharmacy. Please start 1/2 tablet once daily for 1 week and then increase to a full tablet once daily on week two as tolerated.  Some side effects such as nausea, drowsiness and weight gain can occur.  Also rarely people have experienced suicidal thoughts when taking this medication.  Please discontinue the medication and go directly to ED if this occurs.  Id like to see you back in about 1 month to evaluate progress.     Fatigue Fatigue is feeling tired all of the time, a lack of energy, or a lack of motivation. Occasional or mild fatigue is often a normal response to activity or life in general. However, long-lasting (chronic) or extreme fatigue may indicate an underlying medical condition. Follow these instructions at home: Watch your fatigue for any changes. The following actions may help to lessen any discomfort you are feeling:  Talk to your health care provider about how much sleep you need each night. Try to get the required amount every night.  Take medicines only as directed by your health care provider.  Eat a healthy and nutritious diet. Ask your health care provider if you need help changing your diet.  Drink enough fluid to keep your urine clear or pale yellow.  Practice ways of relaxing, such as yoga, meditation, massage therapy, or acupuncture.  Exercise regularly.  Change situations that cause you stress. Try to keep your work and personal routine reasonable.  Do not abuse illegal drugs.  Limit alcohol intake to no more than 1 drink per day for nonpregnant women and 2 drinks per day for men. One drink equals 12 ounces of beer, 5 ounces of wine, or 1 ounces of hard  liquor.  Take a multivitamin, if directed by your health care provider.  Contact a health care provider if:  Your fatigue does not get better.  You have a fever.  You have unintentional weight loss or gain.  You have headaches.  You have difficulty: ? Falling asleep. ? Sleeping throughout the night.  You feel angry, guilty, anxious, or sad.  You are unable to have a bowel movement (constipation).  You skin is dry.  Your legs or another part of your body is swollen. Get help right away if:  You feel confused.  Your vision is blurry.  You feel faint or pass out.  You have a severe headache.  You have severe abdominal, pelvic, or back pain.  You have chest pain, shortness of breath, or an irregular or fast heartbeat.  You are unable to urinate or you urinate less than normal.  You develop abnormal bleeding, such as bleeding from the rectum, vagina, nose, lungs, or nipples.  You vomit blood.  You have thoughts about harming yourself or committing suicide.  You are worried that you might harm someone else. This information is not intended to replace advice given to you by your health care provider. Make sure you discuss any questions you have with your health care provider. Document Released: 09/09/2007 Document Revised: 04/19/2016 Document Reviewed: 03/16/2014 Elsevier Interactive Patient Education  [employer]2018 [employer]

## 2018-06-02 ENCOUNTER — Ambulatory Visit: Payer: BLUE CROSS/BLUE SHIELD | Admitting: Nurse Practitioner

## 2018-09-05 DIAGNOSIS — J209 Acute bronchitis, unspecified: Secondary | ICD-10-CM | POA: Diagnosis not present

## 2018-09-14 IMAGING — US US ABDOMEN LIMITED
1 series · 14 of 25 positions shown · non-contrast
Comparison: 02/21/2017

CLINICAL DATA: Follow-up fatty liver and hepatic cyst

EXAM:
ULTRASOUND ABDOMEN LIMITED RIGHT UPPER QUADRANT

[Series 1: us abdomen limited · 0.33mm/px · 14 of 43 slices shown]
[im 1/43]
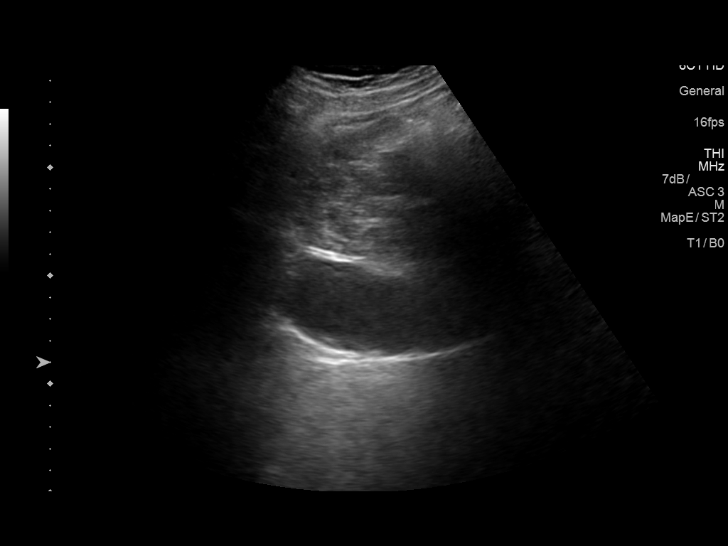
[im 4/43]
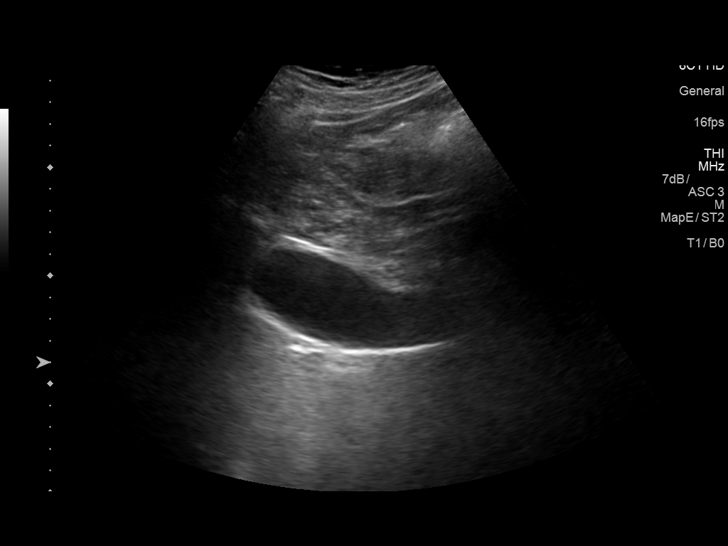
[im 8/43]
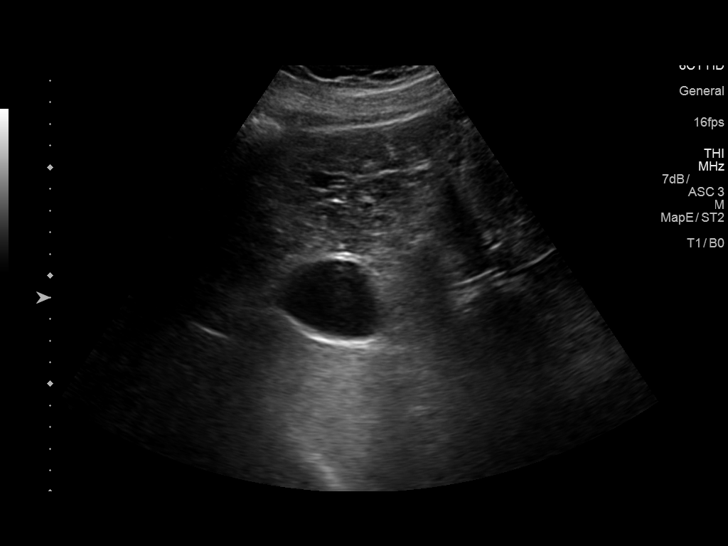
[im 11/43]
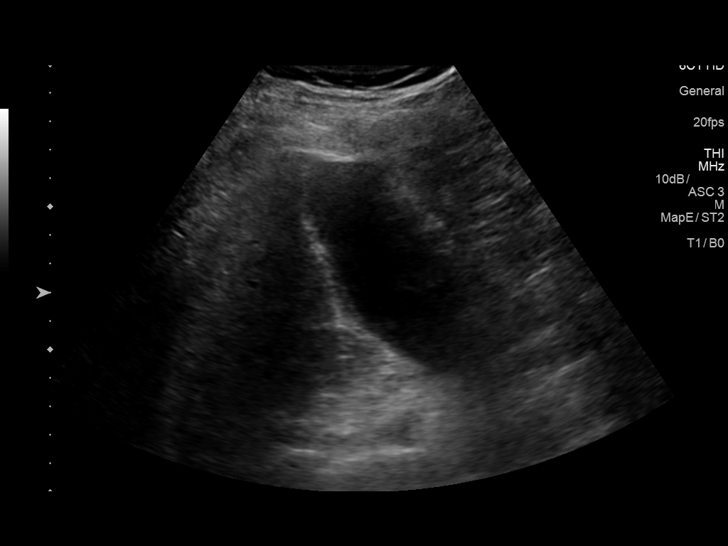
[im 15/43]
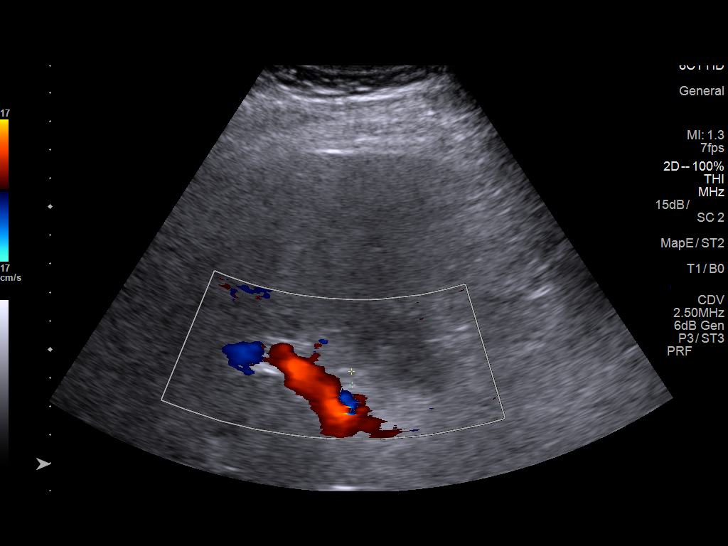
[im 16/43]
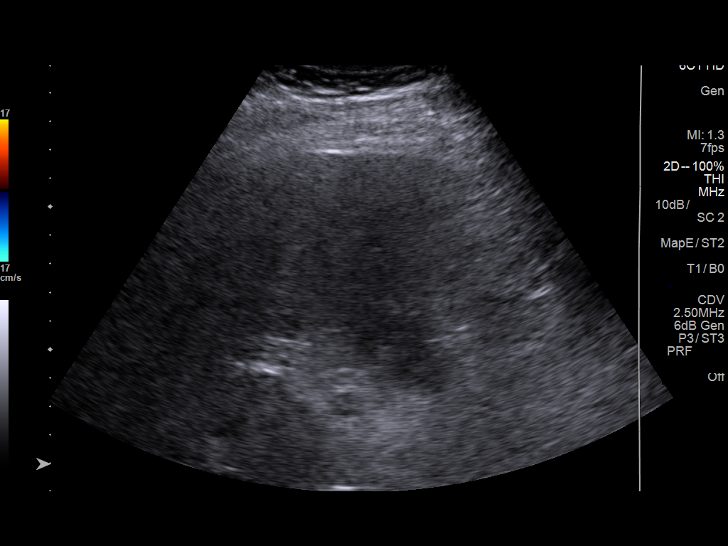
[im 20/43]
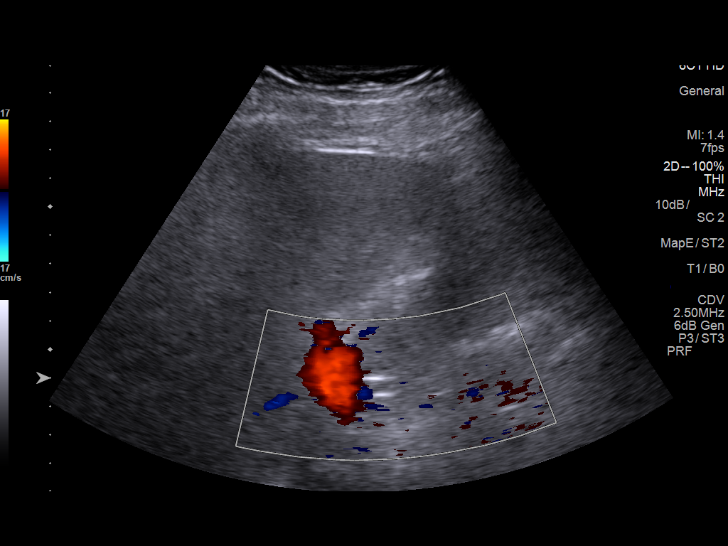
[im 23/43]
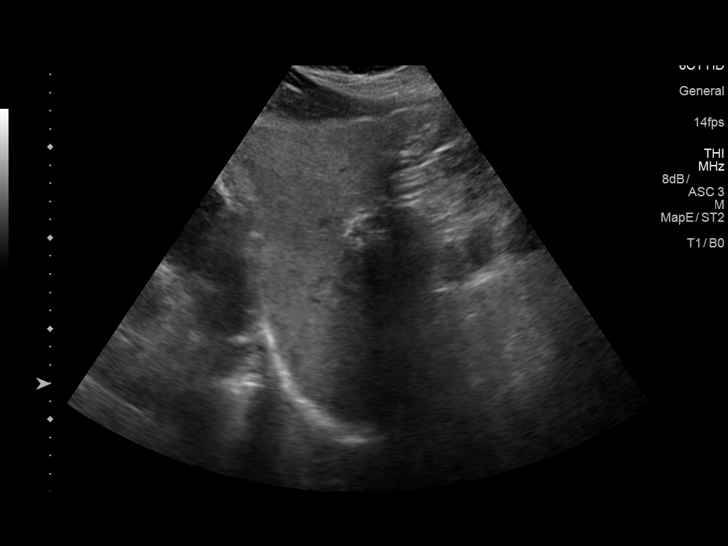
[im 27/43]
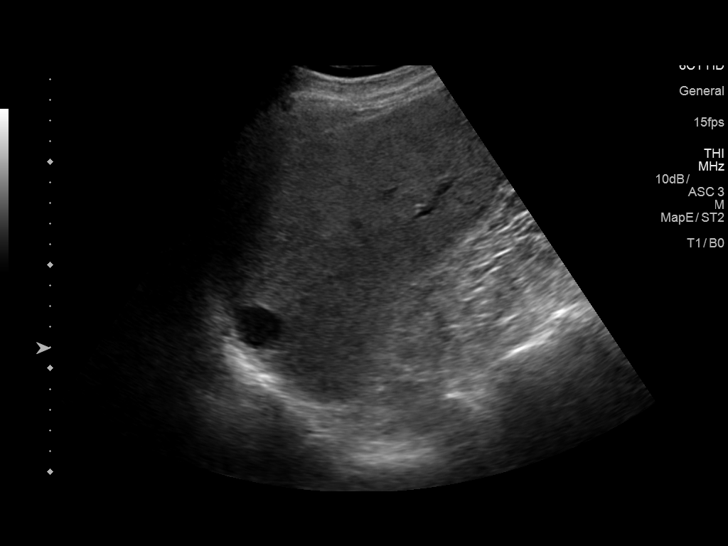
[im 29/43]
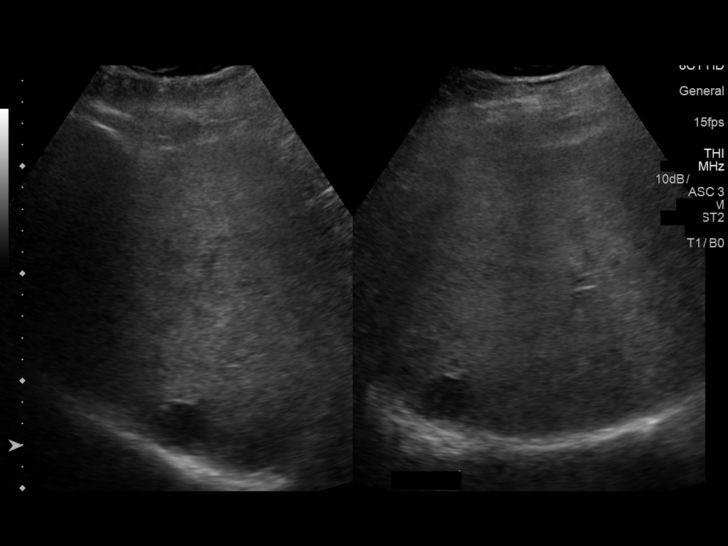
[im 32/43]
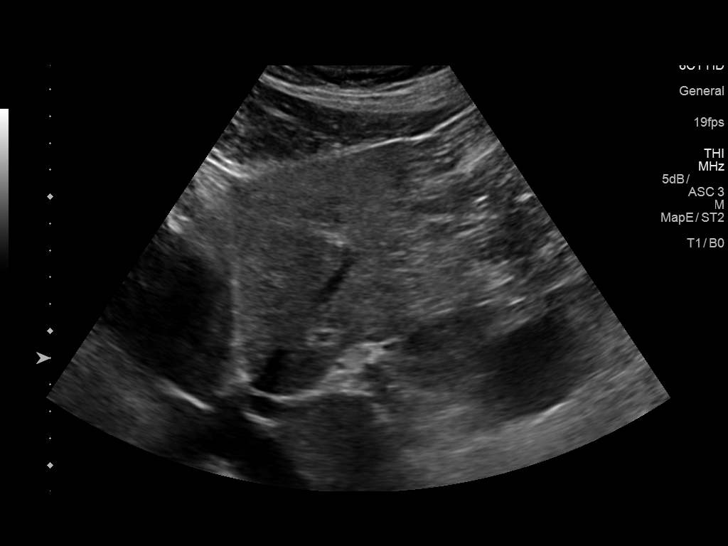
[im 36/43]
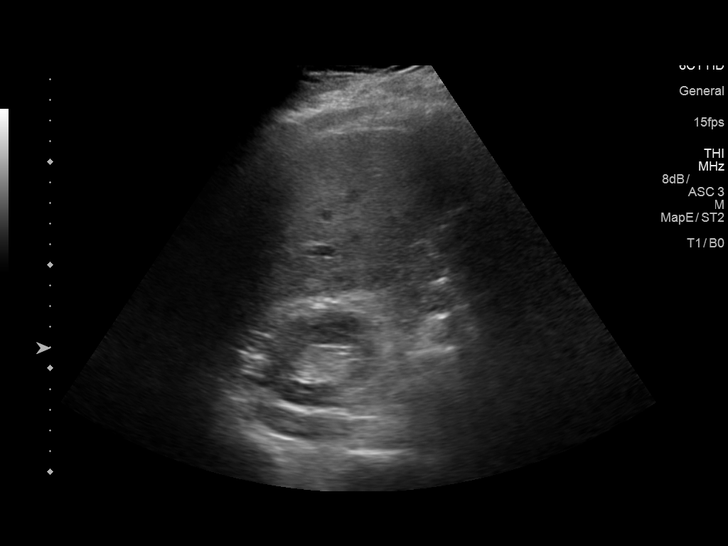
[im 39/43]
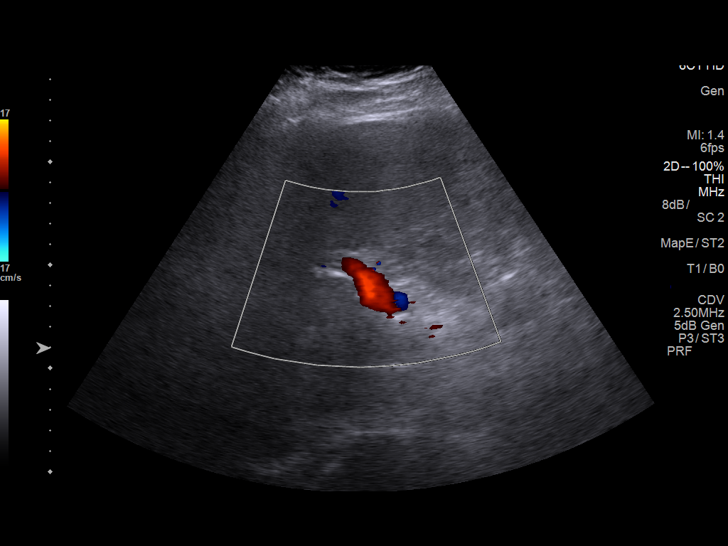
[im 43/43]
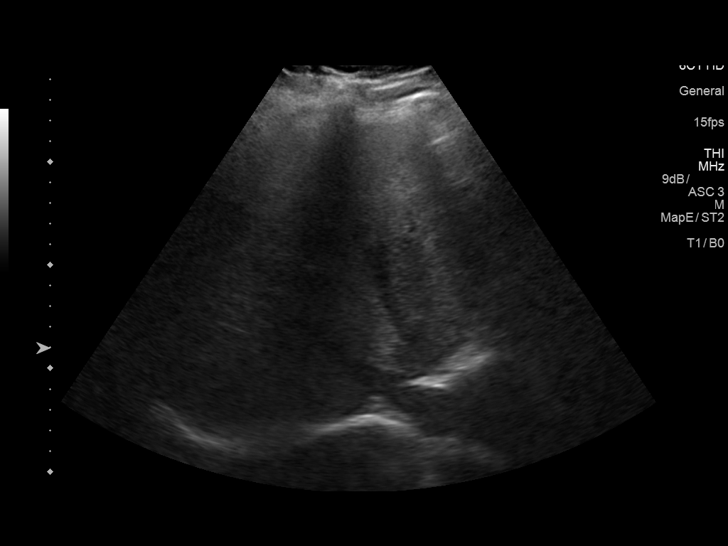

[14 of 25 positions shown; findings below may reference images not displayed]

FINDINGS: Gallbladder:

No gallstones or wall thickening visualized. No sonographic Murphy
sign noted by sonographer.

Common bile duct:

Diameter: 4.7 mm.

Liver:

Diffusely increased in echogenicity consistent with fatty
infiltration. 2.9 cm cyst is again seen in the right lobe of the
liver. The overall appearance is stable from the prior exam. Portal
vein is patent on color Doppler imaging with normal direction of
blood flow towards the liver.
IMPRESSION: Fatty infiltration of the liver and right hepatic cysts stable from
the previous exam. No acute abnormality is noted.

## 2024-06-12 ENCOUNTER — Encounter: Payer: Self-pay | Admitting: Advanced Practice Midwife
